# Patient Record
Sex: Female | Born: 1975 | Race: White | Hispanic: No | Marital: Married | Smoking: Former smoker
Health system: Southern US, Community
[De-identification: ages and names within clinical notes are randomized; demographics above are authoritative.]

## PROBLEM LIST (undated history)

## (undated) DIAGNOSIS — N870 Mild cervical dysplasia: Secondary | ICD-10-CM

## (undated) DIAGNOSIS — B9689 Other specified bacterial agents as the cause of diseases classified elsewhere: Secondary | ICD-10-CM

## (undated) DIAGNOSIS — E785 Hyperlipidemia, unspecified: Secondary | ICD-10-CM

## (undated) DIAGNOSIS — F419 Anxiety disorder, unspecified: Secondary | ICD-10-CM

## (undated) DIAGNOSIS — T7840XA Allergy, unspecified, initial encounter: Secondary | ICD-10-CM

## (undated) DIAGNOSIS — Z8639 Personal history of other endocrine, nutritional and metabolic disease: Secondary | ICD-10-CM

## (undated) DIAGNOSIS — M1990 Unspecified osteoarthritis, unspecified site: Secondary | ICD-10-CM

## (undated) DIAGNOSIS — I10 Essential (primary) hypertension: Secondary | ICD-10-CM

## (undated) DIAGNOSIS — N83209 Unspecified ovarian cyst, unspecified side: Secondary | ICD-10-CM

## (undated) DIAGNOSIS — N760 Acute vaginitis: Secondary | ICD-10-CM

## (undated) DIAGNOSIS — E039 Hypothyroidism, unspecified: Secondary | ICD-10-CM

## (undated) DIAGNOSIS — Q8740 Marfan's syndrome, unspecified: Secondary | ICD-10-CM

## (undated) DIAGNOSIS — Z5189 Encounter for other specified aftercare: Secondary | ICD-10-CM

## (undated) DIAGNOSIS — E559 Vitamin D deficiency, unspecified: Secondary | ICD-10-CM

## (undated) DIAGNOSIS — K219 Gastro-esophageal reflux disease without esophagitis: Secondary | ICD-10-CM

## (undated) HISTORY — DX: Encounter for other specified aftercare: Z51.89

## (undated) HISTORY — DX: Vitamin D deficiency, unspecified: E55.9

## (undated) HISTORY — PX: TUBAL LIGATION: SHX77

## (undated) HISTORY — PX: DILATION AND CURETTAGE OF UTERUS: SHX78

## (undated) HISTORY — DX: Unspecified osteoarthritis, unspecified site: M19.90

## (undated) HISTORY — DX: Anxiety disorder, unspecified: F41.9

## (undated) HISTORY — DX: Acute vaginitis: B96.89

## (undated) HISTORY — DX: Hyperlipidemia, unspecified: E78.5

## (undated) HISTORY — DX: Other specified bacterial agents as the cause of diseases classified elsewhere: N76.0

## (undated) HISTORY — DX: Unspecified ovarian cyst, unspecified side: N83.209

## (undated) HISTORY — DX: Marfan syndrome, unspecified: Q87.40

## (undated) HISTORY — PX: TONSILECTOMY/ADENOIDECTOMY WITH MYRINGOTOMY: SHX6125

## (undated) HISTORY — DX: Mild cervical dysplasia: N87.0

## (undated) HISTORY — DX: Personal history of other endocrine, nutritional and metabolic disease: Z86.39

## (undated) HISTORY — DX: Allergy, unspecified, initial encounter: T78.40XA

## (undated) HISTORY — DX: Essential (primary) hypertension: I10

## (undated) HISTORY — DX: Gastro-esophageal reflux disease without esophagitis: K21.9

## (undated) HISTORY — DX: Hypothyroidism, unspecified: E03.9

---

## 2011-11-13 ENCOUNTER — Emergency Department: Payer: Self-pay | Admitting: Unknown Physician Specialty

## 2011-11-13 LAB — URINALYSIS, COMPLETE
Bilirubin,UR: NEGATIVE
Glucose,UR: NEGATIVE mg/dL (ref 0–75)
Ketone: NEGATIVE
Nitrite: NEGATIVE
RBC,UR: 22 /HPF (ref 0–5)
Specific Gravity: 1.021 (ref 1.003–1.030)
Squamous Epithelial: 7
WBC UR: 62 /HPF (ref 0–5)

## 2011-11-13 LAB — PREGNANCY, URINE: Pregnancy Test, Urine: NEGATIVE m[IU]/mL

## 2013-03-22 ENCOUNTER — Emergency Department: Payer: Self-pay | Admitting: Unknown Physician Specialty

## 2013-10-09 ENCOUNTER — Emergency Department: Payer: Self-pay | Admitting: Emergency Medicine

## 2013-10-09 LAB — URINALYSIS, COMPLETE
Bacteria: NONE SEEN
Blood: NEGATIVE
Glucose,UR: NEGATIVE mg/dL (ref 0–75)
Ketone: NEGATIVE
Leukocyte Esterase: NEGATIVE
Nitrite: NEGATIVE
Ph: 6 (ref 4.5–8.0)
Protein: NEGATIVE
RBC,UR: <1 /HPF (ref 0–5)
Specific Gravity: 1.006 (ref 1.003–1.030)
Squamous Epithelial: <1

## 2013-10-09 LAB — PREGNANCY, URINE: Pregnancy Test, Urine: NEGATIVE m[IU]/mL

## 2013-10-23 ENCOUNTER — Emergency Department: Payer: Self-pay | Admitting: Emergency Medicine

## 2013-10-23 LAB — RAPID INFLUENZA A&B ANTIGENS

## 2013-11-28 ENCOUNTER — Emergency Department: Payer: Self-pay | Admitting: Emergency Medicine

## 2014-11-11 ENCOUNTER — Emergency Department: Payer: Self-pay | Admitting: Emergency Medicine

## 2015-04-29 ENCOUNTER — Ambulatory Visit: Payer: Self-pay | Admitting: Family Medicine

## 2015-05-08 ENCOUNTER — Encounter: Payer: Self-pay | Admitting: Family Medicine

## 2015-05-08 ENCOUNTER — Ambulatory Visit (INDEPENDENT_AMBULATORY_CARE_PROVIDER_SITE_OTHER): Payer: 59 | Admitting: Family Medicine

## 2015-05-08 VITALS — BP 118/80 | HR 103 | Temp 97.9°F | Resp 18 | Ht 64.0 in | Wt 151.2 lb

## 2015-05-08 DIAGNOSIS — B351 Tinea unguium: Secondary | ICD-10-CM | POA: Insufficient documentation

## 2015-05-08 DIAGNOSIS — D8989 Other specified disorders involving the immune mechanism, not elsewhere classified: Secondary | ICD-10-CM | POA: Insufficient documentation

## 2015-05-08 DIAGNOSIS — G9332 Myalgic encephalomyelitis/chronic fatigue syndrome: Secondary | ICD-10-CM | POA: Insufficient documentation

## 2015-05-08 DIAGNOSIS — E559 Vitamin D deficiency, unspecified: Secondary | ICD-10-CM

## 2015-05-08 DIAGNOSIS — R5382 Chronic fatigue, unspecified: Secondary | ICD-10-CM

## 2015-05-08 NOTE — Progress Notes (Signed)
Name: Natalie [name]   MRN: [id number]    DOB: [birth year]   Date:05/08/2015       Progress Note  Subjective  Chief Complaint  Chief Complaint  Patient presents with  . OTHER    vitamin d deficency, follow up lab work    HPI Pt. Is here for follow up of Vitamin D deficiency. A level obtained on April 12 was 23.5. She was started on Vitamin D 50000 units once a week. She has one last capsule remaining and is here to recheck her levels. Otherwise doing well. No muscle or joint pains   Past Medical History  Diagnosis Date  . History of Graves' disease     Past Surgical History  Procedure Laterality Date  . Tubal ligation    . Tonsilectomy/adenoidectomy with myringotomy      Family History  Problem Relation Age of Onset  . Hyperlipidemia Mother   . Hyperthyroidism Mother   . Thyroid cancer Father   . Marfan syndrome Father   . Marfan syndrome Sister   . Depression Sister   . Depression Brother     History   Social History  . Marital Status: Married    Spouse Name: N/A  . Number of Children: N/A  . Years of Education: N/A   Occupational History  . Not on file.   Social History Main Topics  . Smoking status: Current Every Day Smoker -- 0.50 packs/day for 10 years    Types: Cigarettes  . Smokeless tobacco: Not on file  . Alcohol Use: No  . Drug Use: No  . Sexual Activity: Not on file   Other Topics Concern  . Not on file   Social History Narrative  . No narrative on file     Current outpatient prescriptions:  .  terbinafine (LAMISIL) 250 MG tablet, Take by mouth., Disp: , Rfl:  .  Multiple Vitamins-Minerals (WOMENS MULTI VITAMIN & MINERAL PO), Take by mouth., Disp: , Rfl:  .  Vitamin D, Ergocalciferol, (DRISDOL) 50000 UNITS CAPS capsule, Take 50,000 Units by mouth once a week., Disp: , Rfl: 0  Allergies  Allergen Reactions  . Hydrocodone-Acetaminophen Itching  . Oxycodone-Acetaminophen Other (See Comments) and Itching     Review of Systems   Musculoskeletal: Negative for myalgias, back pain and joint pain.      Objective  Filed Vitals:   05/08/15 1139  BP: 118/80  Pulse: 103  Temp: 97.9 F (36.6 C)  Resp: 18  Height: 5' 4"  (1.626 m)  Weight: 151 lb 4 oz (68.607 kg)  SpO2: 97%    Physical Exam  Constitutional: She is oriented to person, place, and time and well-developed, well-nourished, and in no distress.  HENT:  Head: Normocephalic and atraumatic.  Cardiovascular: Normal rate and regular rhythm.   Pulmonary/Chest: Effort normal and breath sounds normal.  Abdominal: Soft. Bowel sounds are normal.  Musculoskeletal: She exhibits no edema.  Neurological: She is alert and oriented to person, place, and time.  Skin: Skin is warm and dry.  Nursing note and vitals reviewed.    Assessment & Plan 1. Avitaminosis D Repeat levels today and follow-up. - Vitamin D (25 hydroxy)    Jennie Hannay Asad A. [facility] 05/08/2015 6:35 PM

## 2015-05-27 ENCOUNTER — Other Ambulatory Visit: Payer: Self-pay | Admitting: Family Medicine

## 2015-05-27 MED ORDER — VITAMIN D (ERGOCALCIFEROL) 1.25 MG (50000 UNIT) PO CAPS: 50000.0000 [IU] | ORAL_CAPSULE | ORAL | Status: DC

## 2015-05-27 NOTE — Telephone Encounter (Signed)
Medication has been refilled and sent to [facility] [address]

## 2015-08-11 ENCOUNTER — Ambulatory Visit: Payer: 59 | Admitting: Family Medicine

## 2016-04-06 ENCOUNTER — Encounter: Payer: Self-pay | Admitting: Medical Oncology

## 2016-04-06 ENCOUNTER — Emergency Department: Payer: Self-pay

## 2016-04-06 ENCOUNTER — Emergency Department
Admission: EM | Admit: 2016-04-06 | Discharge: 2016-04-06 | Disposition: A | Discharge status: Home / Self Care | Payer: Self-pay | Attending: Emergency Medicine | Admitting: Emergency Medicine

## 2016-04-06 DIAGNOSIS — J40 Bronchitis, not specified as acute or chronic: Secondary | ICD-10-CM

## 2016-04-06 DIAGNOSIS — F17210 Nicotine dependence, cigarettes, uncomplicated: Secondary | ICD-10-CM | POA: Insufficient documentation

## 2016-04-06 DIAGNOSIS — J209 Acute bronchitis, unspecified: Secondary | ICD-10-CM | POA: Insufficient documentation

## 2016-04-06 MED ORDER — PREDNISONE 50 MG PO TABS: 50.0000 mg | ORAL_TABLET | Freq: Every day | ORAL | Status: DC

## 2016-04-06 MED ORDER — IPRATROPIUM-ALBUTEROL 0.5-2.5 (3) MG/3ML IN SOLN
3.0000 mL | Freq: Once | RESPIRATORY_TRACT | Status: AC
  Administered 2016-04-06: 3 mL via RESPIRATORY_TRACT
  Filled 2016-04-06: qty 3

## 2016-04-06 MED ORDER — ALBUTEROL SULFATE HFA 108 (90 BASE) MCG/ACT IN AERS: 2.0000 | INHALATION_SPRAY | Freq: Four times a day (QID) | RESPIRATORY_TRACT | Status: DC | PRN

## 2016-04-06 NOTE — ED Notes (Signed)
Pt reports she has been having chills, fever, cough and congestion since Thursday.

## 2016-04-06 NOTE — Discharge Instructions (Signed)
Upper Respiratory Infection, Adult Most upper respiratory infections (URIs) are a viral infection of the air passages leading to the lungs. A URI affects the nose, throat, and upper air passages. The most common type of URI is nasopharyngitis and is typically referred to as "the common cold." URIs run their course and usually go away on their own. Most of the time, a URI does not require medical attention, but sometimes a bacterial infection in the upper airways can follow a viral infection. This is called a secondary infection. Sinus and middle ear infections are common types of secondary upper respiratory infections. Bacterial pneumonia can also complicate a URI. A URI can worsen asthma and chronic obstructive pulmonary disease (COPD). Sometimes, these complications can require emergency medical care and may be life threatening.  CAUSES Almost all URIs are caused by viruses. A virus is a type of germ and can spread from one person to another.  RISKS FACTORS You may be at risk for a URI if:   You smoke.   You have chronic heart or lung disease.  You have a weakened defense (immune) system.   You are very young or very old.   You have nasal allergies or asthma.  You work in crowded or poorly ventilated areas.  You work in health care facilities or schools. SIGNS AND SYMPTOMS  Symptoms typically develop 2-3 days after you come in contact with a cold virus. Most viral URIs last 7-10 days. However, viral URIs from the influenza virus (flu virus) can last 14-18 days and are typically more severe. Symptoms may include:   Runny or stuffy (congested) nose.   Sneezing.   Cough.   Sore throat.   Headache.   Fatigue.   Fever.   Loss of appetite.   Pain in your forehead, behind your eyes, and over your cheekbones (sinus pain).  Muscle aches.  DIAGNOSIS  Your health care provider may diagnose a URI by:  Physical exam.  Tests to check that your symptoms are not due to  another condition such as:  Strep throat.  Sinusitis.  Pneumonia.  Asthma. TREATMENT  A URI goes away on its own with time. It cannot be cured with medicines, but medicines may be prescribed or recommended to relieve symptoms. Medicines may help:  Reduce your fever.  Reduce your cough.  Relieve nasal congestion. HOME CARE INSTRUCTIONS   Take medicines only as directed by your health care provider.   Gargle warm saltwater or take cough drops to comfort your throat as directed by your health care provider.  Use a warm mist humidifier or inhale steam from a shower to increase air moisture. This may make it easier to breathe.  Drink enough fluid to keep your urine clear or pale yellow.   Eat soups and other clear broths and maintain good nutrition.   Rest as needed.   Return to work when your temperature has returned to normal or as your health care provider advises. You may need to stay home longer to avoid infecting others. You can also use a face mask and careful hand washing to prevent spread of the virus.  Increase the usage of your inhaler if you have asthma.   Do not use any tobacco products, including cigarettes, chewing tobacco, or electronic cigarettes. If you need help quitting, ask your health care provider. PREVENTION  The best way to protect yourself from getting a cold is to practice good hygiene.   Avoid oral or hand contact with people with cold   symptoms.   Wash your hands often if contact occurs.  There is no clear evidence that vitamin C, vitamin E, echinacea, or exercise reduces the chance of developing a cold. However, it is always recommended to get plenty of rest, exercise, and practice good nutrition.  SEEK MEDICAL CARE IF:   You are getting worse rather than better.   Your symptoms are not controlled by medicine.   You have chills.  You have worsening shortness of breath.  You have brown or red mucus.  You have yellow or brown nasal  discharge.  You have pain in your face, especially when you bend forward.  You have a fever.  You have swollen neck glands.  You have pain while swallowing.  You have white areas in the back of your throat. SEEK IMMEDIATE MEDICAL CARE IF:   You have severe or persistent:  Headache.  Ear pain.  Sinus pain.  Chest pain.  You have chronic lung disease and any of the following:  Wheezing.  Prolonged cough.  Coughing up blood.  A change in your usual mucus.  You have a stiff neck.  You have changes in your:  Vision.  Hearing.  Thinking.  Mood. MAKE SURE YOU:   Understand these instructions.  Will watch your condition.  Will get help right away if you are not doing well or get worse.   This information is not intended to replace advice given to you by your health care provider. Make sure you discuss any questions you have with your health care provider.   Document Released: 04/20/2001 Document Revised: 03/11/2015 Document Reviewed: 01/30/2014 Elsevier Interactive Patient Education 2016 [employer]  

## 2016-04-06 NOTE — ED Notes (Signed)
Cough cold sx's with fever and chills for about  1 week   States cough is non prod but is having some nasal drainage

## 2016-04-08 NOTE — ED Provider Notes (Signed)
[facility] Emergency Department Provider Note  ____________________________________________  Time seen: On arrival  I have reviewed the triage vital signs and the nursing notes.   HISTORY  Chief Complaint Chills and URI    HPI Natalie [name] is a 40 y.o. female who arrives with her daughter both with complaints of nasal congestion and cough. Patient reports she has had symptoms for approximately one week but the cough seems to be getting worse. She reports subjective fevers.. She does complain of fatigue.No recent travel. No shortness of breath. She does smoke cigarettes.    Past Medical History  Diagnosis Date  . History of Graves' disease     Patient Active Problem List   Diagnosis Date Noted  . Avitaminosis D 05/08/2015  . CFIDS (chronic fatigue and immune dysfunction syndrome) 05/08/2015  . Fungal infection of toenail 05/08/2015    Past Surgical History  Procedure Laterality Date  . Tubal ligation    . Tonsilectomy/adenoidectomy with myringotomy      Current Outpatient Rx  Name  Route  Sig  Dispense  Refill  . albuterol (PROVENTIL HFA;VENTOLIN HFA) 108 (90 Base) MCG/ACT inhaler   Inhalation   Inhale 2 puffs into the lungs every 6 (six) hours as needed for wheezing or shortness of breath.   1 Inhaler   2   . Multiple Vitamins-Minerals (WOMENS MULTI VITAMIN & MINERAL PO)   Oral   Take by mouth.         . predniSONE (DELTASONE) 50 MG tablet   Oral   Take 1 tablet (50 mg total) by mouth daily with breakfast.   3 tablet   0   . Vitamin D, Ergocalciferol, (DRISDOL) 50000 UNITS CAPS capsule   Oral   Take 1 capsule (50,000 Units total) by mouth once a week.   12 capsule   0     Allergies Hydrocodone-acetaminophen and Oxycodone-acetaminophen  Family History  Problem Relation Age of Onset  . Hyperlipidemia Mother   . Hyperthyroidism Mother   . Thyroid cancer Father   . Marfan syndrome Father   . Marfan syndrome Sister   .  Depression Sister   . Depression Brother     Social History Social History  Substance Use Topics  . Smoking status: Current Every Day Smoker -- 0.50 packs/day for 10 years    Types: Cigarettes  . Smokeless tobacco: None  . Alcohol Use: No    Review of Systems  Constitutional: As above Eyes: Negative for visual charge ENT: Negative for sore throat    Musculoskeletal: Negative for joint swelling Skin: Negative for rash. Neurological: Negative for headaches or focal weakness   ____________________________________________   PHYSICAL EXAM:  VITAL SIGNS: ED Triage Vitals  Enc Vitals Group     BP 04/06/16 1002 128/97 mmHg     Pulse Rate 04/06/16 1002 95     Resp 04/06/16 1002 18     Temp 04/06/16 1002 98.5 F (36.9 C)     Temp Source 04/06/16 1002 Oral     SpO2 04/06/16 1002 96 %     Weight 04/06/16 1002 160 lb (72.576 kg)     Height 04/06/16 1002 5' 4"  (1.626 m)     Head Cir --      Peak Flow --      Pain Score --      Pain Loc --      Pain Edu? --      Excl. in [place]? --      Constitutional:  Alert and oriented. Well appearing and in no distress. Eyes: Conjunctivae are normal.  ENT   Head: Normocephalic and atraumatic.   Mouth/Throat: Mucous membranes are moist. Cardiovascular: Normal rate, regular rhythm.  Respiratory: Normal respiratory effort without tachypnea nor retractions.  Gastrointestinal: Soft and non-tender in all quadrants. No distention. There is no CVA tenderness. Musculoskeletal: Nontender with normal range of motion in all extremities. Neurologic:  Normal speech and language. No gross focal neurologic deficits are appreciated. Skin:  Skin is warm, dry and intact. No rash noted. Psychiatric: Mood and affect are normal. Patient exhibits appropriate insight and judgment.  ____________________________________________    LABS (pertinent positives/negatives)  Labs Reviewed - No data to  display  ____________________________________________     ____________________________________________    RADIOLOGY I have personally reviewed any xrays that were ordered on this patient: Chest x-ray changes of bronchitis  ____________________________________________   PROCEDURES  Procedure(s) performed: none   ____________________________________________   INITIAL IMPRESSION / ASSESSMENT AND PLAN / ED COURSE  Pertinent labs & imaging results that were available during my care of the patient were reviewed by me and considered in my medical decision making (see chart for details).  She overall well-appearing. Chest x-ray consistent with bronchitis and this fits with her history of present illness as well. We will treat with steroids and an inhaler. PCP follow-up as needed. Return precautions discussed.  ____________________________________________   FINAL CLINICAL IMPRESSION(S) / ED DIAGNOSES  Final diagnoses:  Bronchitis     Lavonia Drafts, MD 04/08/16 [phone]

## 2016-11-03 LAB — HM PAP SMEAR: HM PAP: NEGATIVE

## 2016-11-09 ENCOUNTER — Other Ambulatory Visit: Payer: Self-pay | Admitting: Family Medicine

## 2016-11-09 DIAGNOSIS — Z1231 Encounter for screening mammogram for malignant neoplasm of breast: Secondary | ICD-10-CM

## 2016-12-08 ENCOUNTER — Ambulatory Visit: Payer: Self-pay

## 2016-12-29 ENCOUNTER — Ambulatory Visit
Admission: RE | Admit: 2016-12-29 | Discharge: 2016-12-29 | Disposition: A | Discharge status: Home / Self Care | Payer: Medicaid Other | Source: Ambulatory Visit | Attending: Family Medicine | Admitting: Family Medicine

## 2016-12-29 ENCOUNTER — Encounter: Payer: Self-pay | Admitting: Radiology

## 2016-12-29 DIAGNOSIS — Z1231 Encounter for screening mammogram for malignant neoplasm of breast: Secondary | ICD-10-CM | POA: Diagnosis present

## 2016-12-29 DIAGNOSIS — N6489 Other specified disorders of breast: Secondary | ICD-10-CM | POA: Insufficient documentation

## 2017-02-07 DIAGNOSIS — E785 Hyperlipidemia, unspecified: Secondary | ICD-10-CM | POA: Insufficient documentation

## 2017-02-07 DIAGNOSIS — Z8279 Family history of other congenital malformations, deformations and chromosomal abnormalities: Secondary | ICD-10-CM | POA: Insufficient documentation

## 2017-02-07 DIAGNOSIS — Z87891 Personal history of nicotine dependence: Secondary | ICD-10-CM | POA: Insufficient documentation

## 2017-02-07 DIAGNOSIS — J449 Chronic obstructive pulmonary disease, unspecified: Secondary | ICD-10-CM | POA: Insufficient documentation

## 2017-02-07 HISTORY — DX: Chronic obstructive pulmonary disease, unspecified: J44.9

## 2017-02-09 ENCOUNTER — Encounter: Payer: Self-pay | Admitting: Obstetrics and Gynecology

## 2017-02-09 ENCOUNTER — Ambulatory Visit (INDEPENDENT_AMBULATORY_CARE_PROVIDER_SITE_OTHER): Payer: Medicaid Other | Admitting: Obstetrics and Gynecology

## 2017-02-09 VITALS — BP 121/82 | HR 91 | Ht 64.0 in | Wt 172.1 lb

## 2017-02-09 DIAGNOSIS — R8789 Other abnormal findings in specimens from female genital organs: Secondary | ICD-10-CM

## 2017-02-09 DIAGNOSIS — R87618 Other abnormal cytological findings on specimens from cervix uteri: Secondary | ICD-10-CM

## 2017-02-09 DIAGNOSIS — N951 Menopausal and female climacteric states: Secondary | ICD-10-CM

## 2017-02-09 NOTE — Progress Notes (Signed)
    GYNECOLOGY CLINIC COLPOSCOPY PROCEDURE NOTE  41 y.o. 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  Patient reports history of abnormal pap smear at age 85, had cryotherapy performed.    ROS: perimenopausal vasomotor symptoms.  Has not had a menstrual cycle since June or July 2017.   Patient given informed consent.  Placed in lithotomy position. Cervix viewed with speculum and colposcope after application of acetic acid.   Colposcopy adequate? Yes  no mosaicism, no punctation, no abnormal vasculature and acetowhite lesion(s) noted at 11 o'clock; corresponding biopsies obtained.  ECC specimen obtained. All specimens were labeled and sent to pathology.   Patient was given post procedure instructions.  Will follow up pathology and manage accordingly.  Routine preventative health maintenance measures emphasized.  Given handout on management options for vasomotor symptoms. Can schedule an appointment to discuss options further.     Rubie Maid, MD [facility]

## 2017-02-09 NOTE — Patient Instructions (Addendum)
Human Papillomavirus Human papillomavirus (HPV) is the most common sexually transmitted infection (STI). It easily spreads from person to person (is highly contagious). HPV infections cause genital warts. Certain types of HPV may cause cancers, including cancer of the lower part of the uterus (cervix), vagina, outer female genital area (vulva), penis, anus, and rectum. HPV may also cause cancers of the oral cavity, such as the throat, tongue, and tonsils. There are many types of HPV. It usually does not cause symptoms. However, sometimes there are wart-like lesions in the throat or warts in the genital area that you can see or feel. It is possible to be infected for long periods and pass HPV to others without knowing it. What are the causes? HPV is caused by a virus that spreads from person to person through sexual contact. This includes oral, vaginal, or anal sex. What increases the risk? The following factors may make you more likely to develop this condition:  Having unprotected oral, vaginal, or anal sex.  Having several sex partners.  Having a sex partner who has other sex partners.  Having or having had another STI.  Having a weak disease-fighting (immune) system.  Having damaged skin in the genital area. What are the signs or symptoms? Most people who have HPV do not have any symptoms. If symptoms are present, they may include:  Wartlike lesions in the throat (from having oral sex).  Warts on the infected skin or mucous membranes.  Genital warts that may itch, burn, bleed, or be painful during sexual intercourse. How is this diagnosed? If wartlike lesions are present in the throat or if genital warts are present, your health care provider can usually diagnose HPV with a physical exam. Genital warts are easily seen. In females, tests may be used to diagnose HPV, including:  A Pap test. A Pap test takes a sample of cells from your cervix to check for cancer and HPV infection.  An  HPV test. This is similar to a Pap test and involves taking a sample of cells from your cervix.  Using a scope to view the cervix (colposcopy). This may be done if a pelvic exam or Pap test is abnormal. A sample of tissue may be removed for testing (biopsy) during the colposcopy. Currently, there is no test to detect HPV in males. How is this treated? There is no treatment for the virus itself. However, there are treatments for the health problems and symptoms HPV can cause. Your health care provider will monitor you closely after you are treated as HPV can come back and may need treatment again. Treatment for HPV may include:  Medicines, which may be injected or applied to genital warts in a cream, lotion, liquid or gel form.  Use of a probe to apply extreme cold (cryotherapy) to the genital warts.  Application of an intense beam of light (laser treatment) on the genital warts.  Use of a probe to apply extreme heat (electrocautery) on the genital warts.  Surgery to remove the genital warts. Follow these instructions at home: Medicines   Take over-the-counter and prescription medicines only as told by your health care provider. This include creams for itching or irritation.  Do not treat genital warts with medicines used for treating hand warts. General instructions   Do not touch or scratch the warts.  Do not have sex while you are being treated.  Do not douche or use tampons during treatment (women).  Tell your sex partner about your infection. He or  she may also need to be treated.  If you become pregnant, tell your health care provider that you have HPV. Your health care provider will monitor you closely during pregnancy to make sure your baby is safe.  Keep all follow-up visits as told by your health care provider. This is important. How is this prevented?  Talk with your health care provider about getting the HPV vaccines. These vaccines prevent some HPV infections and  cancers. The vaccines are recommended for males and females between the ages of 49 and 60. They will not work if you already have HPV, and they are not recommended for pregnant women.  After treatment, use condoms during sex to prevent future infections.  Have only one sex partner.  Have a sex partner who does not have other sex partners.  Get regular Pap tests as directed by your health care provider. Contact a health care provider if:  The treated skin becomes red, swollen, or painful.  You have a fever.  You feel generally ill.  You feel lumps or pimples sticking out in and around your genital area.  You develop bleeding of the vagina or the treatment area.  You have painful sexual intercourse. Summary  Human papillomavirus (HPV) is the most common sexually transmitted infection (STI) and is highly contagious.  Most people carrying HPV do not have any symptoms.  HPV can be prevented with vaccination. The vaccine is recommended for males and females between the ages of 52 and 80.  There is no treatment for the virus itself. However, there are treatments for the health problems and symptoms HPV can cause. This information is not intended to replace advice given to you by your health care provider. Make sure you discuss any questions you have with your health care provider. Document Released: 01/15/2004 Document Revised: 10/03/2016 Document Reviewed: 10/03/2016 Elsevier Interactive Patient Education  2017 [employer].     Menopause and Herbal Products What is menopause? Menopause is the normal time of life when menstrual periods decrease in frequency and eventually stop completely. This process can take several years for some women. Menopause is complete when you have had an absence of menstruation for a full year since your last menstrual period. It usually occurs between the ages of 37 and 102. It is not common for menopause to begin before the age of 2. During menopause,  your body stops producing the female hormones estrogen and progesterone. Common symptoms associated with this loss of hormones (vasomotor symptoms) are:  Hot flashes.  Hot flushes.  Night sweats. Other common symptoms and complications of menopause include:  Decrease in sex drive.  Vaginal dryness and thinning of the walls of the vagina. This can make sex painful.  Dryness of the skin and development of wrinkles.  Headaches.  Tiredness.  Irritability.  Memory problems.  Weight gain.  Bladder infections.  Hair growth on the face and chest.  Inability to reproduce offspring (infertility).  Loss of density in the bones (osteoporosis) increasing your risk for breaks (fractures).  Depression.  Hardening and narrowing of the arteries (atherosclerosis). This increases your risk of heart attack and stroke. What treatment options are available? There are many treatment choices for menopause symptoms. The most common treatment is hormone replacement therapy. Many alternative therapies for menopause are emerging, including the use of herbal products. These supplements can be found in the form of herbs, teas, oils, tinctures, and pills. Common herbal supplements for menopause are made from plants that contain phytoestrogens. Phytoestrogens are  compounds that occur naturally in plants and plant products. They act like estrogen in the body. Foods and herbs that contain phytoestrogens include:  Soy.  Flax seeds.  Red clover.  Ginseng. What menopause symptoms may be helped if I use herbal products?  Vasomotor symptoms. These may be helped by:  Soy. Some studies show that soy may have a moderate benefit for hot flashes.  Black cohosh. There is limited evidence indicating this may be beneficial for hot flashes.  Symptoms that are related to heart and blood vessel disease. These may be helped by soy. Studies have shown that soy can help to lower cholesterol.  Depression. This  may be helped by:  St. John's wort. There is limited evidence that shows this may help mild to moderate depression.  Black cohosh. There is evidence that this may help depression and mood swings.  Osteoporosis. Soy may help to decrease bone loss that is associated with menopause and may prevent osteoporosis. Limited evidence indicates that red clover may offer some bone loss protection as well. Other herbal products that are commonly used during menopause lack enough evidence to support their use as a replacement for conventional menopause therapies. These products include evening primrose, ginseng, and red clover. What are the cases when herbal products should not be used during menopause? Do not use herbal products during menopause without your health care provider's approval if:  You are taking medicine.  You have a preexisting liver condition. Are there any risks in my taking herbal products during menopause? If you choose to use herbal products to help with symptoms of menopause, keep in mind that:  Different supplements have different and unmeasured amounts of herbal ingredients.  Herbal products are not regulated the same way that medicines are.  Concentrations of herbs may vary depending on the way they are prepared. For example, the concentration may be different in a pill, tea, oil, and tincture.  Little is known about the risks of using herbal products, particularly the risks of long-term use.  Some herbal supplements can be harmful when combined with certain medicines. Most commonly reported side effects of herbal products are mild. However, if used improperly, many herbal supplements can cause serious problems. Talk to your health care provider before starting any herbal product. If problems develop, stop taking the supplement and let your health care provider know. This information is not intended to replace advice given to you by your health care provider. Make sure you  discuss any questions you have with your health care provider. Document Released: 04/12/2008 Document Revised: 09/21/2016 Document Reviewed: 04/09/2014 Elsevier Interactive Patient Education  2017 [employer].     Menopause and Hormone Replacement Therapy What is hormone replacement therapy? Hormone replacement therapy (HRT) is the use of artificial (synthetic) hormones to replace hormones that your body stops producing during menopause. Menopause is the normal time of life when menstrual periods stop completely and the ovaries stop producing the female hormones estrogen and progesterone. This lack of hormones can affect your health and cause undesirable symptoms. HRT can relieve some of those symptoms. What are my options for HRT? HRT may consist of the synthetic hormones estrogen and progestin, or it may consist of only estrogen (estrogen-only therapy). You and your health care provider will decide which form of HRT is best for you. If you choose to be on HRT and you have a uterus, estrogen and progestin are usually prescribed. Estrogen-only therapy is used for women who do not have a uterus. Possible  options for taking HRT include:  Pills.  Patches.  Gels.  Sprays.  Vaginal cream.  Vaginal rings.  Vaginal inserts. The amount of hormone(s) that you take and how long you take the hormone(s) varies depending on your individual health. It is important to:  Begin HRT with the lowest possible dosage.  Stop HRT as soon as your health care provider tells you to stop.  Work with your health care provider so that you feel informed and comfortable with your decisions. What are the benefits of HRT? HRT can reduce the frequency and severity of menopausal symptoms. Benefits of HRT vary depending on the menopausal symptoms that you have, the severity of your symptoms, and your overall health. HRT may help to improve the following menopausal symptoms:  Hot flashes and night sweats. These  are sudden feelings of heat that spread over the face and body. The skin may turn red, like a blush. Night sweats are hot flashes that happen while you are sleeping or trying to sleep.  Bone loss (osteoporosis). The body loses calcium more quickly after menopause, causing the bones to become weaker. This can increase the risk for bone breaks (fractures).  Vaginal dryness. The lining of the vagina can become thin and dry, which can cause pain during sexual intercourse or cause infection, burning, or itching.  Urinary tract infections.  Urinary incontinence. This is a decreased ability to control when you urinate.  Irritability.  Short-term memory problems. What are the risks of HRT? Risks of HRT vary depending on your individual health and medical history. Risks of HRT also depend on whether you receive both estrogen and progestin or you receive estrogen only.HRT may increase the risk of:  Spotting. This is when a small amount of bloodleaks from the vagina unexpectedly.  Endometrial cancer. This cancer is in the lining of the uterus (endometrium).  Breast cancer.  Increased density of breast tissue. This can make it harder to find breast cancer on a breast X-ray (mammogram).  Stroke.  Heart attack.  Blood clots.  Gallbladder disease. Risks of HRT can increase if you have any of the following conditions:  Endometrial cancer.  Liver disease.  Heart disease.  Breast cancer.  History of blood clots.  History of stroke. How should I care for myself while I am on HRT?  Take over-the-counter and prescription medicines only as told by your health care provider.  Get mammograms, pelvic exams, and medical checkups as often as told by your health care provider.  Have Pap tests done as often as told by your health care provider. A Pap test is sometimes called a Pap smear. It is a screening test that is used to check for signs of cancer of the cervix and vagina. A Pap test can  also identify the presence of infection or precancerous changes. Pap tests may be done:  Every 3 years, starting at age 14.  Every 5 years, starting after age 41, in combination with testing for human papillomavirus (HPV).  More often or less often depending on other medical conditions you have, your age, and other risk factors.  It is your responsibility to get your Pap test results. Ask your health care provider or the department performing the test when your results will be ready.  Keep all follow-up visits as told by your health care provider. This is important. When should I seek medical care? Talk with your health care provider if:  You have any of these:  Pain or swelling in your  legs.  Shortness of breath.  Chest pain.  Lumps or changes in your breasts or armpits.  Slurred speech.  Pain, burning, or bleeding when you urine.  You develop any of these:  Unusual vaginal bleeding.  Dizziness or headaches.  Weakness or numbness in any part of your arms or legs.  Pain in your abdomen. This information is not intended to replace advice given to you by your health care provider. Make sure you discuss any questions you have with your health care provider. Document Released: 07/24/2003 Document Revised: 09/21/2016 Document Reviewed: 04/28/2015 Elsevier Interactive Patient Education  2017 [employer].

## 2017-02-11 LAB — PATHOLOGY

## 2017-02-14 LAB — PATHOLOGY

## 2017-02-15 ENCOUNTER — Encounter: Payer: Self-pay | Admitting: Obstetrics and Gynecology

## 2017-02-15 DIAGNOSIS — N870 Mild cervical dysplasia: Secondary | ICD-10-CM

## 2017-02-15 HISTORY — DX: Mild cervical dysplasia: N87.0

## 2017-02-21 ENCOUNTER — Encounter: Payer: Self-pay | Admitting: Obstetrics and Gynecology

## 2017-02-21 ENCOUNTER — Encounter: Payer: Medicaid Other | Admitting: Obstetrics and Gynecology

## 2017-02-21 ENCOUNTER — Other Ambulatory Visit: Payer: Self-pay | Admitting: Obstetrics and Gynecology

## 2017-02-21 ENCOUNTER — Ambulatory Visit (INDEPENDENT_AMBULATORY_CARE_PROVIDER_SITE_OTHER): Payer: Medicaid Other | Admitting: Obstetrics and Gynecology

## 2017-02-21 VITALS — BP 95/68 | HR 93 | Ht 64.0 in | Wt 174.1 lb

## 2017-02-21 DIAGNOSIS — N870 Mild cervical dysplasia: Secondary | ICD-10-CM

## 2017-02-21 DIAGNOSIS — N72 Inflammatory disease of cervix uteri: Secondary | ICD-10-CM | POA: Diagnosis not present

## 2017-02-21 DIAGNOSIS — B977 Papillomavirus as the cause of diseases classified elsewhere: Secondary | ICD-10-CM

## 2017-02-21 DIAGNOSIS — N951 Menopausal and female climacteric states: Secondary | ICD-10-CM | POA: Diagnosis not present

## 2017-02-21 NOTE — Progress Notes (Signed)
HPI:      [name] is a 41 y.o. [id number] who LMP was No LMP recorded. Patient is not currently having periods (Reason: Irregular Periods).  Subjective:   She presents today For follow-up of her colposcopy.  She has a remote history of cryo-but normal Paps for the last 25+ years. She is also complaining of night and day hot flashes which seem to be getting worse. She says that she has not had a menstrual period since last July.    Hx: The following portions of the patient's history were reviewed and updated as appropriate:              She  has a past medical history of Dysplasia of cervix, low grade (CIN 1) (02/15/2017); History of Graves' disease; Hyperlipemia; Hypertension; Ovarian cyst; and Vitamin D deficiency. She  does not have any pertinent problems on file. She  has a past surgical history that includes Tubal ligation; Tonsilectomy/adenoidectomy with myringotomy; and Dilation and curettage of uterus. Her family history includes Depression in her brother and sister; Hyperlipidemia in her mother; Hyperthyroidism in her mother; Marfan syndrome in her father and sister; Thyroid cancer in her father. She  reports that she quit smoking about 2 months ago. Her smoking use included Cigarettes. She has a 5.00 pack-year smoking history. She has never used smokeless tobacco. She reports that she does not drink alcohol or use drugs. Current Outpatient Prescriptions on File Prior to Visit  Medication Sig Dispense Refill  . atorvastatin (LIPITOR) 40 MG tablet Take by mouth.    Marland Kitchen buPROPion (WELLBUTRIN XL) 300 MG 24 hr tablet Take by mouth.    . gabapentin (NEURONTIN) 300 MG capsule Take by mouth.    . triamterene-hydrochlorothiazide (MAXZIDE-25) 37.5-25 MG tablet Take by mouth.    . Vitamin D, Ergocalciferol, (DRISDOL) 50000 UNITS CAPS capsule Take 1 capsule (50,000 Units total) by mouth once a week. 12 capsule 0   No current facility-administered medications on file prior to visit.           Review of Systems:  Review of Systems  Constitutional: Denied constitutional symptoms, night sweats, recent illness, fatigue, fever, insomnia and weight loss.  Eyes: Denied eye symptoms, eye pain, photophobia, vision change and visual disturbance.  Ears/Nose/Throat/Neck: Denied ear, nose, throat or neck symptoms, hearing loss, nasal discharge, sinus congestion and sore throat.  Cardiovascular: Denied cardiovascular symptoms, arrhythmia, chest pain/pressure, edema, exercise intolerance, orthopnea and palpitations.  Respiratory: Denied pulmonary symptoms, asthma, pleuritic pain, productive sputum, cough, dyspnea and wheezing.  Gastrointestinal: Denied, gastro-esophageal reflux, melena, nausea and vomiting.  Genitourinary: Denied genitourinary symptoms including symptomatic vaginal discharge, pelvic relaxation issues, and urinary complaints.  Musculoskeletal: Denied musculoskeletal symptoms, stiffness, swelling, muscle weakness and myalgia.  Dermatologic: Denied dermatology symptoms, rash and scar.  Neurologic: Denied neurology symptoms, dizziness, headache, neck pain and syncope.  Psychiatric: Denied psychiatric symptoms, anxiety and depression.  Endocrine: See HPI for additional information.   Meds:   Current Outpatient Prescriptions on File Prior to Visit  Medication Sig Dispense Refill  . atorvastatin (LIPITOR) 40 MG tablet Take by mouth.    Marland Kitchen buPROPion (WELLBUTRIN XL) 300 MG 24 hr tablet Take by mouth.    . gabapentin (NEURONTIN) 300 MG capsule Take by mouth.    . triamterene-hydrochlorothiazide (MAXZIDE-25) 37.5-25 MG tablet Take by mouth.    . Vitamin D, Ergocalciferol, (DRISDOL) 50000 UNITS CAPS capsule Take 1 capsule (50,000 Units total) by mouth once a week. 12 capsule 0   No current facility-administered  medications on file prior to visit.     Objective:     Vitals:   02/21/17 1109  BP: 95/68  Pulse: 93   Colposcopy results reviewed directly with the  patient.  Assessment:    [id number] Patient Active Problem List   Diagnosis Date Noted  . Dysplasia of cervix, low grade (CIN 1) 02/15/2017  . Avitaminosis D 05/08/2015  . CFIDS (chronic fatigue and immune dysfunction syndrome) ([place]) 05/08/2015  . Fungal infection of toenail 05/08/2015     1. CIN I (cervical intraepithelial neoplasia I)   2. High risk human papilloma virus (HPV) infection of cervix   3. Symptomatic menopausal or female climacteric states     CIN-1 with normal ECC.   Patient likely in menopause despite early age. This based on hot flashes and lack of menses.   Plan:            1.  HPV I have discussed HPV with the patient in detail.  She is aware that HPV is a sexually transmitted disease and that it is estimated that approximately 2/3 of all sexually active people in this country have this virus.  I reviewed our basic understanding of this virus and its infectivity and transmission.  We have discussed the problems caused by this virus including external condyloma, vaginal and cervical condyloma, cervical dysplasia and its link to cervical cancer.  Treatment of external condyloma, internal condyloma and dysplasia has also been discussed.  The virus and its natural course and history were reviewed in detail.  The necessity for adequate follow-up including pap-smears, viral testing and possible Colposcopy with biopsies has been discussed.  A number of treatment options have also been reviewed.  The use of condoms to help decrease the likelihood of spread of this virus was discussed with the patient.  Literature on HPV virus was provided.  All of the patient's questions were answered.  I have recommended a follow-up colposcopy in 6 months. Should that prove the same were improved consider return to annual Pap.  2.  We have discussed possible HRT and the patient seems interested. I would first prove menopause so I have ordered an [facility]. Patient to be seen for HRT if elevated  FSH. Orders Orders Placed This Encounter  Procedures  . Follicle stimulating hormone          F/U  Return in about 6 months (around 08/23/2017) for We will contact her with any abnormal test results. I spent 19 minutes with this patient of which greater than 50% was spent discussing abnormal Pap smear, colposcopy, colposcopy follow-up, HPV, menopause, HRT.  Finis Bud, M.D. 02/21/2017 11:39 AM

## 2017-02-22 LAB — FOLLICLE STIMULATING HORMONE: FSH: 63 m[IU]/mL

## 2017-02-23 ENCOUNTER — Telehealth: Payer: Self-pay

## 2017-02-23 NOTE — Telephone Encounter (Signed)
-----   Message from Harlin Heys, MD sent at 02/23/2017 11:52 AM EDT ----- Based on these results, please have her schedule a visit with me.

## 2017-02-23 NOTE — Telephone Encounter (Signed)
Message sent through Mychart

## 2017-03-01 ENCOUNTER — Ambulatory Visit (INDEPENDENT_AMBULATORY_CARE_PROVIDER_SITE_OTHER): Payer: Medicaid Other | Admitting: Obstetrics and Gynecology

## 2017-03-01 ENCOUNTER — Encounter: Payer: Self-pay | Admitting: Obstetrics and Gynecology

## 2017-03-01 VITALS — BP 127/81 | HR 103 | Ht 64.5 in | Wt 175.0 lb

## 2017-03-01 DIAGNOSIS — N951 Menopausal and female climacteric states: Secondary | ICD-10-CM | POA: Diagnosis not present

## 2017-03-01 MED ORDER — LEVONORGEST-ETH ESTRAD 91-DAY 0.15-0.03 &0.01 MG PO TABS: 1.0000 | ORAL_TABLET | Freq: Every day | ORAL | 1 refills | Status: DC

## 2017-03-01 NOTE — Progress Notes (Signed)
HPI:      [name] is a 41 y.o. [id number] who LMP was No LMP recorded (exact date). Patient is not currently having periods (Reason: Irregular Periods).  Subjective:   She presents today To discuss her hot flashes and difficulty sleeping. She underwent an [place] was noted to be elevated consistent with menopause.    Hx: The following portions of the patient's history were reviewed and updated as appropriate:              She  has a past medical history of Dysplasia of cervix, low grade (CIN 1) (02/15/2017); History of Graves' disease; Hyperlipemia; Hypertension; Ovarian cyst; and Vitamin D deficiency. She  does not have any pertinent problems on file. She  has a past surgical history that includes Tubal ligation; Tonsilectomy/adenoidectomy with myringotomy; and Dilation and curettage of uterus. Her family history includes Depression in her brother and sister; Hyperlipidemia in her mother; Hyperthyroidism in her mother; Marfan syndrome in her father and sister; Thyroid cancer in her father. She  reports that she quit smoking about 2 months ago. Her smoking use included Cigarettes. She has a 5.00 pack-year smoking history. She has never used smokeless tobacco. She reports that she does not drink alcohol or use drugs. Current Outpatient Prescriptions on File Prior to Visit  Medication Sig Dispense Refill  . atorvastatin (LIPITOR) 40 MG tablet Take by mouth.    Marland Kitchen buPROPion (WELLBUTRIN XL) 300 MG 24 hr tablet Take by mouth.    . gabapentin (NEURONTIN) 300 MG capsule Take by mouth.    . triamterene-hydrochlorothiazide (MAXZIDE-25) 37.5-25 MG tablet Take by mouth.    . Vitamin D, Ergocalciferol, (DRISDOL) 50000 UNITS CAPS capsule Take 1 capsule (50,000 Units total) by mouth once a week. 12 capsule 0   No current facility-administered medications on file prior to visit.          Review of Systems:  Review of Systems  Constitutional: Denied constitutional symptoms, night sweats, recent  illness, fatigue, fever, insomnia and weight loss.  Eyes: Denied eye symptoms, eye pain, photophobia, vision change and visual disturbance.  Ears/Nose/Throat/Neck: Denied ear, nose, throat or neck symptoms, hearing loss, nasal discharge, sinus congestion and sore throat.  Cardiovascular: Denied cardiovascular symptoms, arrhythmia, chest pain/pressure, edema, exercise intolerance, orthopnea and palpitations.  Respiratory: Denied pulmonary symptoms, asthma, pleuritic pain, productive sputum, cough, dyspnea and wheezing.  Gastrointestinal: Denied, gastro-esophageal reflux, melena, nausea and vomiting.  Genitourinary: Denied genitourinary symptoms including symptomatic vaginal discharge, pelvic relaxation issues, and urinary complaints.  Musculoskeletal: Denied musculoskeletal symptoms, stiffness, swelling, muscle weakness and myalgia.  Dermatologic: Denied dermatology symptoms, rash and scar.  Neurologic: Denied neurology symptoms, dizziness, headache, neck pain and syncope.  Psychiatric: Denied psychiatric symptoms, anxiety and depression.  Endocrine: See HPI for additional information.   Meds:   Current Outpatient Prescriptions on File Prior to Visit  Medication Sig Dispense Refill  . atorvastatin (LIPITOR) 40 MG tablet Take by mouth.    Marland Kitchen buPROPion (WELLBUTRIN XL) 300 MG 24 hr tablet Take by mouth.    . gabapentin (NEURONTIN) 300 MG capsule Take by mouth.    . triamterene-hydrochlorothiazide (MAXZIDE-25) 37.5-25 MG tablet Take by mouth.    . Vitamin D, Ergocalciferol, (DRISDOL) 50000 UNITS CAPS capsule Take 1 capsule (50,000 Units total) by mouth once a week. 12 capsule 0   No current facility-administered medications on file prior to visit.     Objective:     Vitals:   03/01/17 1324  BP: 127/81  Pulse: (!) 103  Assessment:    [id number] Patient Active Problem List   Diagnosis Date Noted  . Dysplasia of cervix, low grade (CIN 1) 02/15/2017  . Avitaminosis D  05/08/2015  . CFIDS (chronic fatigue and immune dysfunction syndrome) ([place]) 05/08/2015  . Fungal infection of toenail 05/08/2015     1. Symptomatic menopausal or female climacteric states     [place] elevated consistent with menopausal state. This is somewhat early menopause at age 56.  Of significant note patient has discontinued tobacco use and has remained off of it for more than 6 months.   Plan:            1.  HRT I have discussed HRT with the patient in detail.  The risk/benefits of it were reviewed.  She understands that during menopause Estrogen decreases dramatically and that this results in an increased risk of cardiovascular disease as well as osteoporosis.  We have also discussed the fact that hot flashes often result from a decrease in Estrogen, and that by replacing Estrogen, they can often be alleviated.  We have discussed skin, vaginal and urinary tract changes that may also take place from this drop in Estrogen.  Emotional changes have also been linked to Estrogen and we have briefly discussed this.  The benefits of HRT including decrease in hot flashes, vaginal dryness, and osteoporosis were discussed.  The emotional benefit and a possible change in her cardiovascular risk profile was also reviewed.  The risks associated with Hormone Replacement Therapy were also reviewed.  The use of unopposed Estrogen and its relationship to endometrial cancer was discussed.  The addition of Progesterone and its beneficial effect on endometrial cancer was also noted.  The fact that there has been no consistent definitive studies showing an increase in breast cancer in women who use HRT was discussed with the patient.  The possible side effects including breast tenderness, fluid retention, mood changes and vaginal bleeding were discussed.  The patient was informed that this is an elective medication and that she may choose not to take Hormone Replacement Therapy.  Literature on HRT was given, and I  believe that after answering all of the patient's questions, she has an adequate and informed understanding of HRT.  Special emphasis on the WHI study, as well as several studies since that pertaining to the risks and benefits of estrogen replacement therapy were compared.  The possible limitations of these studies were discussed including the age stratification of the WHI study.  The possible role of Progesterone in these studies was discussed in detail.  I believe that the patient has an informed knowledge of the risks and benefits of HRT.  I have specifically discussed WHI findings and current updates.  Different type of hormone formulation and methods of taking hormone replacement therapy discussed.  We have specifically discussed multiple forms of HRT including patches and pills. We have discussed the risks and benefits of menopausal levels of HRT versus OCPs. We have especially considering the young age of this patient. She has elected to begin OCPs. Orders No orders of the defined types were placed in this encounter.    Meds ordered this encounter  Medications  . Levonorgestrel-Ethinyl Estradiol (AMETHIA,CAMRESE) 0.15-0.03 &0.01 MG tablet    Sig: Take 1 tablet by mouth at bedtime.    Dispense:  84 tablet    Refill:  1        F/U  Return in about 3 months (around 05/31/2017). I spent 20 minutes with this patient of which  greater than 50% was spent discussing HRT and all of its risks benefits and methods.  Finis Bud, M.D. 03/01/2017 2:47 PM

## 2017-03-23 ENCOUNTER — Telehealth: Payer: Self-pay | Admitting: Obstetrics and Gynecology

## 2017-03-23 NOTE — Telephone Encounter (Signed)
Patient is still having the hot flashes and trouble sleeping  Please call concerning the [facility] pills

## 2017-04-07 ENCOUNTER — Telehealth: Payer: Self-pay | Admitting: Obstetrics and Gynecology

## 2017-04-07 NOTE — Telephone Encounter (Signed)
Patient lvm stating she has questions about current medication the patient is taking. Patient did not disclose any other information. Please advise.

## 2017-05-31 ENCOUNTER — Encounter: Payer: Medicaid Other | Admitting: Obstetrics and Gynecology

## 2017-06-07 ENCOUNTER — Encounter: Payer: Medicaid Other | Admitting: Obstetrics and Gynecology

## 2017-08-23 ENCOUNTER — Encounter: Payer: Medicaid Other | Admitting: Obstetrics and Gynecology

## 2017-09-07 ENCOUNTER — Ambulatory Visit (INDEPENDENT_AMBULATORY_CARE_PROVIDER_SITE_OTHER): Payer: Medicaid Other | Admitting: Obstetrics and Gynecology

## 2017-09-07 ENCOUNTER — Encounter: Payer: Self-pay | Admitting: Obstetrics and Gynecology

## 2017-09-07 VITALS — BP 116/79 | HR 94 | Ht 64.5 in | Wt 177.1 lb

## 2017-09-07 DIAGNOSIS — N951 Menopausal and female climacteric states: Secondary | ICD-10-CM

## 2017-09-07 DIAGNOSIS — N870 Mild cervical dysplasia: Secondary | ICD-10-CM

## 2017-09-07 MED ORDER — CONJ ESTROG-MEDROXYPROGEST ACE 0.625-5 MG PO TABS: 1.0000 | ORAL_TABLET | Freq: Every day | ORAL | 3 refills | Status: DC

## 2017-09-07 NOTE — Progress Notes (Signed)
    GYNECOLOGY PROGRESS NOTE  Subjective:    Patient ID: Natalie [name], female    DOB: 1976-06-23, 41 y.o.   MRN: [id number]  HPI  Patient is a 41 y.o. [id number] female who presents for 6 month colposcopy follow for history of abnormal pap smear. Patient with NILM HPV HR+ pap smear on 11/03/2016.  She then underwent colposcopy on 02/09/17, which noted CIN I on cervical biopsy with negative ECC.  Denies complaints today.   Of note, patient also desires to discuss management of her menopausal vasomotor symptoms and difficulty sleeping. Patient's menopausal status was confirmed by laboratory assessment.  States that she was placed on OCPs last visit.  Notes that the medication did help the hot flashes, but caused her to start having a period again so she discontinued  The following portions of the patient's history were reviewed and updated as appropriate: allergies, current medications, past family history, past medical history, past social history, past surgical history and problem list.  Review of Systems Pertinent items noted in HPI and remainder of comprehensive ROS otherwise negative.   Objective:   Blood pressure 116/79, pulse 94, height 5' 4.5" (1.638 m), weight 177 lb 1.6 oz (80.3 kg). General appearance: alert and no distress Abdomen: soft, non-tender; bowel sounds normal; no masses,  no organomegaly Pelvic: see below in colposcopy procedure note   Assessment:   CIN I Menopausal vasomotor symptoms  Plan:   1. CIN I - patient presented for colposcopy (see procedure note below).  Will f/u pathology and manage accordingly.  2. Menopausal symptoms - discussed other treatment options for patient, including menopausal levels of HRT as well as non-hormonal options (i.e. Brisdelle, Effexor, Clonidine).  Patient notes she will try the menopausal levels of HRT.  Given handouts on both.  Initiated on Prempro.  To f/u if symptoms worsen.     GYNECOLOGY CLINIC COLPOSCOPY PROCEDURE  NOTE  41 y.o. [id number] here for colposcopy for CIN I on previous biopsy 02/2017,  With NILM HR HPV+ pap smear on 11/03/2017. 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  Patient given informed consent, signed copy in the chart, time out was performed.  Placed in lithotomy position. Cervix viewed with speculum and colposcope after application of acetic acid.   Colposcopy adequate? Yes  no visible lesions, no mosaicism, no punctation and no abnormal vasculature; no biopsies obtained.  ECC specimen obtained. All specimens were labeled and sent to pathology.   Patient was given post procedure instructions.  Will follow up pathology and manage accordingly; patient will be contacted with results and recommendations.  Routine preventative health maintenance measures emphasized.    A total of 15 minutes were spent face-to-face with the patient during this encounter and over half of that time dealt with counseling and coordination of care.   Rubie Maid, MD [facility]

## 2017-09-09 LAB — PATHOLOGY

## 2017-11-29 ENCOUNTER — Other Ambulatory Visit: Payer: Self-pay | Admitting: Family Medicine

## 2017-12-01 ENCOUNTER — Other Ambulatory Visit: Payer: Self-pay | Admitting: Family Medicine

## 2017-12-01 DIAGNOSIS — Z1231 Encounter for screening mammogram for malignant neoplasm of breast: Secondary | ICD-10-CM

## 2017-12-29 ENCOUNTER — Telehealth: Payer: Self-pay | Admitting: Obstetrics and Gynecology

## 2017-12-29 NOTE — Telephone Encounter (Signed)
The patient called and stated that she would like to have a nurse call her back in regards to her getting her Gardisel injection. Please advise.

## 2018-01-02 NOTE — Telephone Encounter (Signed)
Pt received a called back and she is going to make an appt to get her tdap shot soon.

## 2018-01-09 ENCOUNTER — Ambulatory Visit (INDEPENDENT_AMBULATORY_CARE_PROVIDER_SITE_OTHER): Payer: Medicaid Other | Admitting: Certified Nurse Midwife

## 2018-01-09 DIAGNOSIS — Z23 Encounter for immunization: Secondary | ICD-10-CM

## 2018-01-09 NOTE — Progress Notes (Signed)
Pt is here for her 1st gardasil inj, she tolerated well

## 2018-01-10 NOTE — Progress Notes (Signed)
I have reviewed the record and concur with patient management and plan.    Diona Fanti, CNM [facility], [facility]

## 2018-01-23 ENCOUNTER — Encounter: Payer: Self-pay | Admitting: Radiology

## 2018-01-23 ENCOUNTER — Ambulatory Visit
Admission: RE | Admit: 2018-01-23 | Discharge: 2018-01-23 | Disposition: A | Discharge status: Home / Self Care | Payer: Medicaid Other | Source: Ambulatory Visit | Attending: Family Medicine | Admitting: Family Medicine

## 2018-01-23 DIAGNOSIS — Z1231 Encounter for screening mammogram for malignant neoplasm of breast: Secondary | ICD-10-CM | POA: Diagnosis not present

## 2018-03-09 ENCOUNTER — Encounter: Payer: Medicaid Other | Admitting: Obstetrics and Gynecology

## 2018-03-10 ENCOUNTER — Ambulatory Visit: Payer: Medicaid Other

## 2018-03-13 ENCOUNTER — Ambulatory Visit: Payer: Medicaid Other

## 2018-03-17 ENCOUNTER — Ambulatory Visit (INDEPENDENT_AMBULATORY_CARE_PROVIDER_SITE_OTHER): Payer: Medicaid Other | Admitting: Obstetrics and Gynecology

## 2018-03-17 VITALS — BP 116/88 | HR 81 | Ht 64.5 in | Wt 185.2 lb

## 2018-03-17 DIAGNOSIS — Z23 Encounter for immunization: Secondary | ICD-10-CM | POA: Diagnosis not present

## 2018-03-17 NOTE — Progress Notes (Signed)
Pt is here for a NV for #2 Gardasil inj.

## 2018-03-23 ENCOUNTER — Encounter: Payer: Self-pay | Admitting: Obstetrics and Gynecology

## 2018-03-23 NOTE — Progress Notes (Signed)
I have reviewed the record and concur with patient management and plan.  Rubie Maid, MD [facility]

## 2018-04-14 ENCOUNTER — Encounter: Payer: Self-pay | Admitting: Obstetrics and Gynecology

## 2018-04-14 ENCOUNTER — Encounter: Payer: Medicaid Other | Admitting: Obstetrics and Gynecology

## 2018-04-14 ENCOUNTER — Ambulatory Visit (INDEPENDENT_AMBULATORY_CARE_PROVIDER_SITE_OTHER): Payer: Medicaid Other | Admitting: Obstetrics and Gynecology

## 2018-04-14 VITALS — BP 105/74 | HR 94 | Ht 64.5 in | Wt 188.6 lb

## 2018-04-14 DIAGNOSIS — R635 Abnormal weight gain: Secondary | ICD-10-CM

## 2018-04-14 DIAGNOSIS — Z0000 Encounter for general adult medical examination without abnormal findings: Secondary | ICD-10-CM

## 2018-04-14 DIAGNOSIS — N951 Menopausal and female climacteric states: Secondary | ICD-10-CM | POA: Diagnosis not present

## 2018-04-14 DIAGNOSIS — Z01419 Encounter for gynecological examination (general) (routine) without abnormal findings: Secondary | ICD-10-CM | POA: Diagnosis not present

## 2018-04-14 DIAGNOSIS — N870 Mild cervical dysplasia: Secondary | ICD-10-CM

## 2018-04-14 DIAGNOSIS — E669 Obesity, unspecified: Secondary | ICD-10-CM | POA: Diagnosis not present

## 2018-04-14 MED ORDER — CONJ ESTROG-MEDROXYPROGEST ACE 0.625-2.5 MG PO TABS: 1.0000 | ORAL_TABLET | Freq: Every day | ORAL | 6 refills | Status: DC

## 2018-04-14 NOTE — Progress Notes (Addendum)
GYNECOLOGY ANNUAL PHYSICAL EXAM PROGRESS NOTE  Subjective:    Natalie [name] is a 42 y.o. [phone] female who presents for an annual exam.  The patient is sexually active. The patient has been on hormone replacement therapy in the past (for ~ 2-3 months) but began noting side effects (restarted her periods, PMB?). The patient wears seatbelts: yes. The patient participates in regular exercise: is just initiating an exercise program at the gym.Marland Kitchen Has the patient ever been transfused or tattooed?: no. The patient reports that there is not domestic violence in her life.   The patient has the following complaints today: 1.  The patient complains of weight gain since discontinuing her HRT.  She has gained approximately 30 pounds over the past 6 months.  Notes that she has started an exercise program recently at her gym, and has tried several over-the-counter weight loss supplements (cayenne pepper, apple-cider vinegar) with no success. 2. Notes her hot flashes have returned since stopping the HRT.    Gynecologic History Menarche age: 71 No LMP recorded. Patient is menopausal. Contraception: post menopausal status History of STI's: H/o HPV Last Pap: 10/2017. Results were: abnormal (NILM but + HR HPV) with CIN I on colposcopy in 01/2018.  Reports remote h/o mildly abnormal pap smear in the past. Last mammogram: 01/2018. Results were: normal   OB History  Gravida Para Term Preterm AB Living  5 3 2 1 2 2   SAB TAB Ectopic Multiple Live Births  2 0 0 0 3    # Outcome Date GA Lbr Len/2nd Weight Sex Delivery Anes PTL Lv  5 Term         LIV  4 SAB           3 SAB           2 Term         ND  1 Preterm     F Vag-Spont   LIV    Past Medical History:  Diagnosis Date  . Dysplasia of cervix, low grade (CIN 1) 02/15/2017   Will need repeat pap smear in 1 year.   Marland Kitchen History of Graves' disease   . Hyperlipemia   . Hypertension   . Ovarian cyst   . Vitamin D deficiency     Past Surgical History:   Procedure Laterality Date  . DILATION AND CURETTAGE OF UTERUS    . TONSILECTOMY/ADENOIDECTOMY WITH MYRINGOTOMY    . TUBAL LIGATION      Family History  Problem Relation Age of Onset  . Hyperlipidemia Mother   . Hyperthyroidism Mother   . Thyroid cancer Father   . Marfan syndrome Father   . Marfan syndrome Sister   . Depression Sister   . Depression Brother     Social History   Socioeconomic History  . Marital status: Married    Spouse name: Not on file  . Number of children: Not on file  . Years of education: Not on file  . Highest education level: Not on file  Occupational History  . Not on file  Social Needs  . Financial resource strain: Not on file  . Food insecurity:    Worry: Not on file    Inability: Not on file  . Transportation needs:    Medical: Not on file    Non-medical: Not on file  Tobacco Use  . Smoking status: Former Smoker    Packs/day: 0.50    Years: 10.00    Pack years: 5.00  Types: Cigarettes    Last attempt to quit: 12/12/2016    Years since quitting: 1.3  . Smokeless tobacco: Never Used  Substance and Sexual Activity  . Alcohol use: No    Alcohol/week: 0.0 oz  . Drug use: No  . Sexual activity: Yes    Birth control/protection: None  Lifestyle  . Physical activity:    Days per week: Not on file    Minutes per session: Not on file  . Stress: Not on file  Relationships  . Social connections:    Talks on phone: Not on file    Gets together: Not on file    Attends religious service: Not on file    Active member of club or organization: Not on file    Attends meetings of clubs or organizations: Not on file    Relationship status: Not on file  . Intimate partner violence:    Fear of current or ex partner: Not on file    Emotionally abused: Not on file    Physically abused: Not on file    Forced sexual activity: Not on file  Other Topics Concern  . Not on file  Social History Narrative  . Not on file    Current Outpatient  Medications on File Prior to Visit  Medication Sig Dispense Refill  . atorvastatin (LIPITOR) 40 MG tablet Take by mouth.    Marland Kitchen buPROPion (WELLBUTRIN XL) 300 MG 24 hr tablet Take by mouth.    . gabapentin (NEURONTIN) 300 MG capsule Take by mouth.    . meclizine (ANTIVERT) 25 MG tablet Take 25 mg by mouth 3 (three) times daily as needed.  2  . mometasone (ELOCON) 0.1 % ointment 1 APPLICATION TOPICAL EVERY DAY  1  . pantoprazole (PROTONIX) 40 MG tablet Take 40 mg by mouth daily.  5  . triamterene-hydrochlorothiazide (MAXZIDE-25) 37.5-25 MG tablet Take by mouth.    . Vitamin D, Ergocalciferol, (DRISDOL) 50000 UNITS CAPS capsule Take 1 capsule (50,000 Units total) by mouth once a week. 12 capsule 0  . estrogen, conjugated,-medroxyprogesterone (PREMPRO) 0.625-5 MG tablet Take 1 tablet by mouth daily. (Patient not taking: Reported on 03/17/2018) 30 tablet 3   No current facility-administered medications on file prior to visit.     Allergies  Allergen Reactions  . Hydrocodone-Acetaminophen Itching  . Oxycodone-Acetaminophen Other (See Comments) and Itching     Review of Systems Constitutional: negative for chills, fatigue, fevers and sweats.  Positive for weight gain Eyes: negative for irritation, redness and visual disturbance Ears, nose, mouth, throat, and face: negative for hearing loss, nasal congestion, snoring and tinnitus Respiratory: negative for asthma, cough, sputum Cardiovascular: negative for chest pain, dyspnea, exertional chest pressure/discomfort, irregular heart beat, palpitations and syncope Gastrointestinal: negative for abdominal pain, change in bowel habits, nausea and vomiting Genitourinary: negative for abnormal menstrual periods, genital lesions, sexual problems and vaginal discharge, dysuria and urinary incontinence Integument/breast: negative for breast lump, breast tenderness and nipple discharge Hematologic/lymphatic: negative for bleeding and easy  bruising Musculoskeletal:negative for back pain and muscle weakness Neurological: negative for dizziness, headaches, vertigo and weakness Endocrine: negative for diabetic symptoms including polydipsia, polyuria and skin dryness Allergic/Immunologic: negative for hay fever and urticaria      Objective:  Blood pressure 105/74, pulse 94, height 5' 4.5" (1.638 m), weight 188 lb 9.6 oz (85.5 kg). Body mass index is 31.87 kg/m.   General Appearance:    Alert, cooperative, no distress, appears stated age  Head:    Normocephalic, without obvious  abnormality, atraumatic  Eyes:    PERRL, conjunctiva/corneas clear, EOM's intact, both eyes  Ears:    Normal external ear canals, both ears  Nose:   Nares normal, septum midline, mucosa normal, no drainage or sinus tenderness  Throat:   Lips, mucosa, and tongue normal; teeth and gums normal  Neck:   Supple, symmetrical, trachea midline, no adenopathy; thyroid: no enlargement/tenderness/nodules; no carotid bruit or JVD  Back:     Symmetric, no curvature, ROM normal, no CVA tenderness  Lungs:     Clear to auscultation bilaterally, respirations unlabored  Chest Wall:    No tenderness or deformity   Heart:    Regular rate and rhythm, S1 and S2 normal, no murmur, rub or gallop  Breast Exam:    No tenderness, masses, or nipple abnormality  Abdomen:     Soft, non-tender, bowel sounds active all four quadrants, no masses, no organomegaly.    Genitalia:    Pelvic:external genitalia normal, vagina without lesions, discharge, or tenderness, rectovaginal septum  normal. Cervix normal in appearance, no cervical motion tenderness, no adnexal masses or tenderness.  Uterus normal size, shape, mobile, regular contours, nontender.  Rectal:    Normal external sphincter.  No hemorrhoids appreciated. Internal exam not done.   Extremities:   Extremities normal, atraumatic, no cyanosis or edema  Pulses:   2+ and symmetric all extremities  Skin:   Skin color, texture, turgor  normal, no rashes or lesions  Lymph nodes:   Cervical, supraclavicular, and axillary nodes normal  Neurologic:   CNII-XII intact, normal strength, sensation and reflexes throughout   .  Labs:  No results found for: WBC, HGB, HCT, MCV, PLT  No results found for: CREATININE, BUN, NA, K, CL, CO2  No results found for: ALT, AST, GGT, ALKPHOS, BILITOT  No results found for: TSH   Assessment:   Well woman exam with routine gynecological exam Menopausal symptoms Recent weight gain CIN I (cervical intraepithelial neoplasia I) Obesity (BMI 30.0-34.9)   Plan:     Blood tests: None.  Patient plans to have labs drawn by her PCP within the next 2 weeks. Breast self exam technique reviewed and patient encouraged to perform self-exam monthly. Contraception: post menopausal status. Discussed healthy lifestyle modifications. Discussed other herbal/OTC remedies for weight management. Also counseled on dietary modification and patient has just recently initiated an exercise program. If no change in symptoms, can consider prescription medication for weight loss management.  Mammogram up-to-date. Pap smear to be performed in December.  Patient will follow-up in 6 months Patient with bothersome menopausal vasomotor symptoms. Reiterated lifestyle interventions such as wearing light clothing, remaining in cool environments, having fan/air conditioner in the room, avoiding hot beverages etc.  Discussed using hormone therapy and concerns about increased risk of heart disease, cerebrovascular disease, thromboembolic disease, and breast cancer.  Also discussed other medical options such as Paxil, Effexor, Brisdelle, Clonidine,  or Neurontin.  Patient opted to resume her HRT, but will try lowering dose of progesterone therapy. Prempro 0.625/2.5 mg prescribed She will noitify by Mychart if she has any issues as her current insurance ([employer]), limits the number of problem visits.     Rubie Maid, MD [facility]

## 2018-04-14 NOTE — Progress Notes (Signed)
Pt is present today for her annual woman exam. Pt stated that since she has stopped taking the medication for menopause she has been gaining weight. Pt stated that she is excercising but still gains weight. Pt stated that she is doing well other wise and denies any itching, burning or discharge.

## 2018-04-14 NOTE — Patient Instructions (Signed)
Health Maintenance for Postmenopausal Women Menopause is a normal process in which your reproductive ability comes to an end. This process happens gradually over a span of months to years, usually between the ages of 22 and 9. Menopause is complete when you have missed 12 consecutive menstrual periods. It is important to talk with your health care provider about some of the most common conditions that affect postmenopausal women, such as heart disease, cancer, and bone loss (osteoporosis). Adopting a healthy lifestyle and getting preventive care can help to promote your health and wellness. Those actions can also lower your chances of developing some of these common conditions. What should I know about menopause? During menopause, you may experience a number of symptoms, such as:  Moderate-to-severe hot flashes.  Night sweats.  Decrease in sex drive.  Mood swings.  Headaches.  Tiredness.  Irritability.  Memory problems.  Insomnia.  Choosing to treat or not to treat menopausal changes is an individual decision that you make with your health care provider. What should I know about hormone replacement therapy and supplements? Hormone therapy products are effective for treating symptoms that are associated with menopause, such as hot flashes and night sweats. Hormone replacement carries certain risks, especially as you become older. If you are thinking about using estrogen or estrogen with progestin treatments, discuss the benefits and risks with your health care provider. What should I know about heart disease and stroke? Heart disease, heart attack, and stroke become more likely as you age. This may be due, in part, to the hormonal changes that your body experiences during menopause. These can affect how your body processes dietary fats, triglycerides, and cholesterol. Heart attack and stroke are both medical emergencies. There are many things that you can do to help prevent heart disease  and stroke:  Have your blood pressure checked at least every 1-2 years. High blood pressure causes heart disease and increases the risk of stroke.  If you are 53-22 years old, ask your health care provider if you should take aspirin to prevent a heart attack or a stroke.  Do not use any tobacco products, including cigarettes, chewing tobacco, or electronic cigarettes. If you need help quitting, ask your health care provider.  It is important to eat a healthy diet and maintain a healthy weight. ? Be sure to include plenty of vegetables, fruits, low-fat dairy products, and lean protein. ? Avoid eating foods that are high in solid fats, added sugars, or salt (sodium).  Get regular exercise. This is one of the most important things that you can do for your health. ? Try to exercise for at least 150 minutes each week. The type of exercise that you do should increase your heart rate and make you sweat. This is known as moderate-intensity exercise. ? Try to do strengthening exercises at least twice each week. Do these in addition to the moderate-intensity exercise.  Know your numbers.Ask your health care provider to check your cholesterol and your blood glucose. Continue to have your blood tested as directed by your health care provider.  What should I know about cancer screening? There are several types of cancer. Take the following steps to reduce your risk and to catch any cancer development as early as possible. Breast Cancer  Practice breast self-awareness. ? This means understanding how your breasts normally appear and feel. ? It also means doing regular breast self-exams. Let your health care provider know about any changes, no matter how small.  If you are 40  or older, have a clinician do a breast exam (clinical breast exam or CBE) every year. Depending on your age, family history, and medical history, it may be recommended that you also have a yearly breast X-ray (mammogram).  If you  have a family history of breast cancer, talk with your health care provider about genetic screening.  If you are at high risk for breast cancer, talk with your health care provider about having an MRI and a mammogram every year.  Breast cancer (BRCA) gene test is recommended for women who have family members with BRCA-related cancers. Results of the assessment will determine the need for genetic counseling and BRCA1 and for BRCA2 testing. BRCA-related cancers include these types: ? Breast. This occurs in males or females. ? Ovarian. ? Tubal. This may also be called fallopian tube cancer. ? Cancer of the abdominal or pelvic lining (peritoneal cancer). ? Prostate. ? Pancreatic.  Cervical, Uterine, and Ovarian Cancer Your health care provider may recommend that you be screened regularly for cancer of the pelvic organs. These include your ovaries, uterus, and vagina. This screening involves a pelvic exam, which includes checking for microscopic changes to the surface of your cervix (Pap test).  For women ages 21-65, health care providers may recommend a pelvic exam and a Pap test every three years. For women ages 79-65, they may recommend the Pap test and pelvic exam, combined with testing for human papilloma virus (HPV), every five years. Some types of HPV increase your risk of cervical cancer. Testing for HPV may also be done on women of any age who have unclear Pap test results.  Other health care providers may not recommend any screening for nonpregnant women who are considered low risk for pelvic cancer and have no symptoms. Ask your health care provider if a screening pelvic exam is right for you.  If you have had past treatment for cervical cancer or a condition that could lead to cancer, you need Pap tests and screening for cancer for at least 20 years after your treatment. If Pap tests have been discontinued for you, your risk factors (such as having a new sexual partner) need to be  reassessed to determine if you should start having screenings again. Some women have medical problems that increase the chance of getting cervical cancer. In these cases, your health care provider may recommend that you have screening and Pap tests more often.  If you have a family history of uterine cancer or ovarian cancer, talk with your health care provider about genetic screening.  If you have vaginal bleeding after reaching menopause, tell your health care provider.  There are currently no reliable tests available to screen for ovarian cancer.  Lung Cancer Lung cancer screening is recommended for adults 69-62 years old who are at high risk for lung cancer because of a history of smoking. A yearly low-dose CT scan of the lungs is recommended if you:  Currently smoke.  Have a history of at least 30 pack-years of smoking and you currently smoke or have quit within the past 15 years. A pack-year is smoking an average of one pack of cigarettes per day for one year.  Yearly screening should:  Continue until it has been 15 years since you quit.  Stop if you develop a health problem that would prevent you from having lung cancer treatment.  Colorectal Cancer  This type of cancer can be detected and can often be prevented.  Routine colorectal cancer screening usually begins at  age 42 and continues through age 45.  If you have risk factors for colon cancer, your health care provider may recommend that you be screened at an earlier age.  If you have a family history of colorectal cancer, talk with your health care provider about genetic screening.  Your health care provider may also recommend using home test kits to check for hidden blood in your stool.  A small camera at the end of a tube can be used to examine your colon directly (sigmoidoscopy or colonoscopy). This is done to check for the earliest forms of colorectal cancer.  Direct examination of the colon should be repeated every  5-10 years until age 71. However, if early forms of precancerous polyps or small growths are found or if you have a family history or genetic risk for colorectal cancer, you may need to be screened more often.  Skin Cancer  Check your skin from head to toe regularly.  Monitor any moles. Be sure to tell your health care provider: ? About any new moles or changes in moles, especially if there is a change in a mole's shape or color. ? If you have a mole that is larger than the size of a pencil eraser.  If any of your family members has a history of skin cancer, especially at a young age, talk with your health care provider about genetic screening.  Always use sunscreen. Apply sunscreen liberally and repeatedly throughout the day.  Whenever you are outside, protect yourself by wearing long sleeves, pants, a wide-brimmed hat, and sunglasses.  What should I know about osteoporosis? Osteoporosis is a condition in which bone destruction happens more quickly than new bone creation. After menopause, you may be at an increased risk for osteoporosis. To help prevent osteoporosis or the bone fractures that can happen because of osteoporosis, the following is recommended:  If you are 46-71 years old, get at least 1,000 mg of calcium and at least 600 mg of vitamin D per day.  If you are older than age 55 but younger than age 65, get at least 1,200 mg of calcium and at least 600 mg of vitamin D per day.  If you are older than age 54, get at least 1,200 mg of calcium and at least 800 mg of vitamin D per day.  Smoking and excessive alcohol intake increase the risk of osteoporosis. Eat foods that are rich in calcium and vitamin D, and do weight-bearing exercises several times each week as directed by your health care provider. What should I know about how menopause affects my mental health? Depression may occur at any age, but it is more common as you become older. Common symptoms of depression  include:  Low or sad mood.  Changes in sleep patterns.  Changes in appetite or eating patterns.  Feeling an overall lack of motivation or enjoyment of activities that you previously enjoyed.  Frequent crying spells.  Talk with your health care provider if you think that you are experiencing depression. What should I know about immunizations? It is important that you get and maintain your immunizations. These include:  Tetanus, diphtheria, and pertussis (Tdap) booster vaccine.  Influenza every year before the flu season begins.  Pneumonia vaccine.  Shingles vaccine.  Your health care provider may also recommend other immunizations. This information is not intended to replace advice given to you by your health care provider. Make sure you discuss any questions you have with your health care provider. Document Released: 12/17/2005  Document Revised: 05/14/2016 Document Reviewed: 07/29/2015 Elsevier Interactive Patient Education  2018 [employer].  Preventing Unhealthy [employer], Adult Staying at a healthy weight is important. When fat builds up in your body, you may become overweight or obese. These conditions put you at greater risk for developing certain health problems, such as heart disease, diabetes, sleeping problems, joint problems, and some cancers. Unhealthy weight gain is often the result of making unhealthy choices in what you eat. It is also a result of not getting enough exercise. You can make changes to your lifestyle to prevent obesity and stay as healthy as possible. What nutrition changes can be made? To maintain a healthy weight and prevent obesity:  Eat only as much as your body needs. To do this: ? Pay attention to signs that you are hungry or full. Stop eating as soon as you feel full. ? If you feel hungry, try drinking water first. Drink enough water so your urine is clear or pale yellow. ? Eat smaller portions. ? Look at serving sizes on food labels. Most  foods contain more than one serving per container. ? Eat the recommended amount of calories for your gender and activity level. While most active people should eat around 2,000 calories per day, if you are trying to lose weight or are not very active, you main need to eat less calories. Talk to your health care provider or dietitian about how many calories you should eat each day.  Choose healthy foods, such as: ? Fruits and vegetables. Try to fill at least half of your plate at each meal with fruits and vegetables. ? Whole grains, such as whole wheat bread, brown rice, and quinoa. ? Lean meats, such as chicken or fish. ? Other healthy proteins, such as beans, eggs, or tofu. ? Healthy fats, such as nuts, seeds, fatty fish, and olive oil. ? Low-fat or fat-free dairy.  Check food labels and avoid food and drinks that: ? Are high in calories. ? Have added sugar. ? Are high in sodium. ? Have saturated fats or trans fats.  Limit how much you eat of the following foods: ? Prepackaged meals. ? Fast food. ? Fried foods. ? Processed meat, such as bacon, sausage, and deli meats. ? Fatty cuts of red meat and poultry with skin.  Cook foods in healthier ways, such as by baking, broiling, or grilling.  When grocery shopping, try to shop around the outside of the store. This helps you buy mostly fresh foods and avoid canned and prepackaged foods.  What lifestyle changes can be made?  Exercise at least 30 minutes 5 or more days each week. Exercising includes brisk walking, yard work, biking, running, swimming, and team sports like basketball and soccer. Ask your health care provider which exercises are safe for you.  Do not use any products that contain nicotine or tobacco, such as cigarettes and e-cigarettes. If you need help quitting, ask your health care provider.  Limit alcohol intake to no more than 1 drink a day for nonpregnant women and 2 drinks a day for men. One drink equals 12 oz of beer,  5 oz of wine, or 1 oz of hard liquor.  Try to get 7-9 hours of sleep each night. What other changes can be made?  Keep a food and activity journal to keep track of: ? What you ate and how many calories you had. Remember to count sauces, dressings, and side dishes. ? Whether you were active, and what exercises you  did. ? Your calorie, weight, and activity goals.  Check your weight regularly. Track any changes. If you notice you have gained weight, make changes to your diet or activity routine.  Avoid taking weight-loss medicines or supplements. Talk to your health care provider before starting any new medicine or supplement.  Talk to your health care provider before trying any new diet or exercise plan. Why are these changes important? Eating healthy, staying active, and having healthy habits not only help prevent obesity, they also:  Help you to manage stress and emotions.  Help you to connect with friends and family.  Improve your self-esteem.  Improve your sleep.  Prevent long-term health problems.  What can happen if changes are not made? Being obese or overweight can cause you to develop joint or bone problems, which can make it hard for you to stay active or do activities you enjoy. Being obese or overweight also puts stress on your heart and lungs and can lead to health problems like diabetes, heart disease, and some cancers. Where to find more information: Talk with your health care provider or a dietitian about healthy eating and healthy lifestyle choices. You may also find other information through these resources:  [place] Department of Agriculture MyPlate: FormerBoss.no  American Heart Association: www.heart.org  Centers for Disease Control and Prevention: http://www.wolf.info/  Summary  Staying at a healthy weight is important. It helps prevent certain diseases and health problems, such as heart disease, diabetes, joint problems, sleep disorders, and some  cancers.  Being obese or overweight can cause you to develop joint or bone problems, which can make it hard for you to stay active or do activities you enjoy.  You can prevent unhealthy weight gain by eating a healthy diet, exercising regularly, not smoking, limiting alcohol, and getting enough sleep.  Talk with your health care provider or a dietitian for guidance about healthy eating and healthy lifestyle choices. This information is not intended to replace advice given to you by your health care provider. Make sure you discuss any questions you have with your health care provider. Document Released: 10/26/2016 Document Revised: 12/01/2016 Document Reviewed: 12/01/2016 Elsevier Interactive Patient Education  2018 [employer].  Menopause and Hormone Replacement Therapy What is hormone replacement therapy? Hormone replacement therapy (HRT) is the use of artificial (synthetic) hormones to replace hormones that your body stops producing during menopause. Menopause is the normal time of life when menstrual periods stop completely and the ovaries stop producing the female hormones estrogen and progesterone. This lack of hormones can affect your health and cause undesirable symptoms. HRT can relieve some of those symptoms. What are my options for HRT? HRT may consist of the synthetic hormones estrogen and progestin, or it may consist of only estrogen (estrogen-only therapy). You and your health care provider will decide which form of HRT is best for you. If you choose to be on HRT and you have a uterus, estrogen and progestin are usually prescribed. Estrogen-only therapy is used for women who do not have a uterus. Possible options for taking HRT include:  Pills.  Patches.  Gels.  Sprays.  Vaginal cream.  Vaginal rings.  Vaginal inserts.  The amount of hormone(s) that you take and how long you take the hormone(s) varies depending on your individual health. It is important to:  Begin  HRT with the lowest possible dosage.  Stop HRT as soon as your health care provider tells you to stop.  Work with your health care provider so that  you feel informed and comfortable with your decisions.  What are the benefits of HRT? HRT can reduce the frequency and severity of menopausal symptoms. Benefits of HRT vary depending on the menopausal symptoms that you have, the severity of your symptoms, and your overall health. HRT may help to improve the following menopausal symptoms:  Hot flashes and night sweats. These are sudden feelings of heat that spread over the face and body. The skin may turn red, like a blush. Night sweats are hot flashes that happen while you are sleeping or trying to sleep.  Bone loss (osteoporosis). The body loses calcium more quickly after menopause, causing the bones to become weaker. This can increase the risk for bone breaks (fractures).  Vaginal dryness. The lining of the vagina can become thin and dry, which can cause pain during sexual intercourse or cause infection, burning, or itching.  Urinary tract infections.  Urinary incontinence. This is a decreased ability to control when you urinate.  Irritability.  Short-term memory problems.  What are the risks of HRT? Risks of HRT vary depending on your individual health and medical history. Risks of HRT also depend on whether you receive both estrogen and progestin or you receive estrogen only.HRT may increase the risk of:  Spotting. This is when a small amount of bloodleaks from the vagina unexpectedly.  Endometrial cancer. This cancer is in the lining of the uterus (endometrium).  Breast cancer.  Increased density of breast tissue. This can make it harder to find breast cancer on a breast X-ray (mammogram).  Stroke.  Heart attack.  Blood clots.  Gallbladder disease.  Risks of HRT can increase if you have any of the following conditions:  Endometrial cancer.  Liver disease.  Heart  disease.  Breast cancer.  History of blood clots.  History of stroke.  How should I care for myself while I am on HRT?  Take over-the-counter and prescription medicines only as told by your health care provider.  Get mammograms, pelvic exams, and medical checkups as often as told by your health care provider.  Have Pap tests done as often as told by your health care provider. A Pap test is sometimes called a Pap smear. It is a screening test that is used to check for signs of cancer of the cervix and vagina. A Pap test can also identify the presence of infection or precancerous changes. Pap tests may be done: ? Every 3 years, starting at age 23. ? Every 5 years, starting after age 59, in combination with testing for human papillomavirus (HPV). ? More often or less often depending on other medical conditions you have, your age, and other risk factors.  It is your responsibility to get your Pap test results. Ask your health care provider or the department performing the test when your results will be ready.  Keep all follow-up visits as told by your health care provider. This is important. When should I seek medical care? Talk with your health care provider if:  You have any of these: ? Pain or swelling in your legs. ? Shortness of breath. ? Chest pain. ? Lumps or changes in your breasts or armpits. ? Slurred speech. ? Pain, burning, or bleeding when you urine.  You develop any of these: ? Unusual vaginal bleeding. ? Dizziness or headaches. ? Weakness or numbness in any part of your arms or legs. ? Pain in your abdomen.  This information is not intended to replace advice given to you by your health  care provider. Make sure you discuss any questions you have with your health care provider. Document Released: 07/24/2003 Document Revised: 09/21/2016 Document Reviewed: 04/28/2015 Elsevier Interactive Patient Education  2017 [employer].

## 2018-09-18 ENCOUNTER — Ambulatory Visit (INDEPENDENT_AMBULATORY_CARE_PROVIDER_SITE_OTHER): Payer: Medicaid Other | Admitting: Obstetrics and Gynecology

## 2018-09-18 ENCOUNTER — Encounter: Payer: Self-pay | Admitting: Obstetrics and Gynecology

## 2018-09-18 VITALS — BP 122/84 | HR 90 | Ht 64.5 in | Wt 182.0 lb

## 2018-09-18 DIAGNOSIS — Z23 Encounter for immunization: Secondary | ICD-10-CM | POA: Diagnosis not present

## 2018-09-18 NOTE — Progress Notes (Signed)
Pt presents today for 3rd Gardasil injection.  Gardasil given in left deltoid, no side effects noted. Follow up PRN  BP 122/84   Pulse 90   Ht 5' 4.5" (1.638 m)   Wt 182 lb (82.6 kg)   BMI 30.76 kg/m

## 2019-02-26 ENCOUNTER — Other Ambulatory Visit: Payer: Self-pay | Admitting: Family Medicine

## 2019-02-26 DIAGNOSIS — Z1231 Encounter for screening mammogram for malignant neoplasm of breast: Secondary | ICD-10-CM

## 2019-04-12 DIAGNOSIS — N899 Noninflammatory disorder of vagina, unspecified: Secondary | ICD-10-CM | POA: Diagnosis not present

## 2019-04-12 DIAGNOSIS — N760 Acute vaginitis: Secondary | ICD-10-CM | POA: Diagnosis not present

## 2019-04-12 DIAGNOSIS — N390 Urinary tract infection, site not specified: Secondary | ICD-10-CM | POA: Diagnosis not present

## 2019-04-17 ENCOUNTER — Encounter: Payer: Self-pay | Admitting: Obstetrics and Gynecology

## 2019-04-17 ENCOUNTER — Other Ambulatory Visit (HOSPITAL_COMMUNITY)
Admission: RE | Admit: 2019-04-17 | Discharge: 2019-04-17 | Disposition: A | Discharge status: Home / Self Care | Payer: Medicaid Other | Source: Ambulatory Visit | Attending: Obstetrics and Gynecology | Admitting: Obstetrics and Gynecology

## 2019-04-17 ENCOUNTER — Other Ambulatory Visit: Payer: Self-pay

## 2019-04-17 ENCOUNTER — Ambulatory Visit (INDEPENDENT_AMBULATORY_CARE_PROVIDER_SITE_OTHER): Payer: Medicaid Other | Admitting: Obstetrics and Gynecology

## 2019-04-17 VITALS — BP 111/82 | HR 99 | Ht 64.5 in | Wt 179.7 lb

## 2019-04-17 DIAGNOSIS — R635 Abnormal weight gain: Secondary | ICD-10-CM | POA: Diagnosis not present

## 2019-04-17 DIAGNOSIS — E669 Obesity, unspecified: Secondary | ICD-10-CM | POA: Diagnosis not present

## 2019-04-17 DIAGNOSIS — L292 Pruritus vulvae: Secondary | ICD-10-CM

## 2019-04-17 DIAGNOSIS — N951 Menopausal and female climacteric states: Secondary | ICD-10-CM | POA: Diagnosis not present

## 2019-04-17 DIAGNOSIS — N870 Mild cervical dysplasia: Secondary | ICD-10-CM | POA: Diagnosis not present

## 2019-04-17 DIAGNOSIS — Z01419 Encounter for gynecological examination (general) (routine) without abnormal findings: Secondary | ICD-10-CM | POA: Diagnosis not present

## 2019-04-17 DIAGNOSIS — N952 Postmenopausal atrophic vaginitis: Secondary | ICD-10-CM

## 2019-04-17 DIAGNOSIS — N390 Urinary tract infection, site not specified: Secondary | ICD-10-CM

## 2019-04-17 DIAGNOSIS — Z01411 Encounter for gynecological examination (general) (routine) with abnormal findings: Secondary | ICD-10-CM

## 2019-04-17 NOTE — Patient Instructions (Addendum)
Perimenopause  Perimenopause is the normal time of life before and after menstrual periods stop completely (menopause). Perimenopause can begin 2-8 years before menopause, and it usually lasts for 1 year after menopause. During perimenopause, the ovaries may or may not produce an egg. What are the causes? This condition is caused by a natural change in hormone levels that happens as you get older. What increases the risk? This condition is more likely to start at an earlier age if you have certain medical conditions or treatments, including:  A tumor of the pituitary gland in the brain.  A disease that affects the ovaries and hormone production.  Radiation treatment for cancer.  Certain cancer treatments, such as chemotherapy or hormone (anti-estrogen) therapy.  Heavy smoking and excessive alcohol use.  Family history of early menopause. What are the signs or symptoms? Perimenopausal changes affect each woman differently. Symptoms of this condition may include:  Hot flashes.  Night sweats.  Irregular menstrual periods.  Decreased sex drive.  Vaginal dryness.  Headaches.  Mood swings.  Depression.  Memory problems or trouble concentrating.  Irritability.  Tiredness.  Weight gain.  Anxiety.  Trouble getting pregnant. How is this diagnosed? This condition is diagnosed based on your medical history, a physical exam, your age, your menstrual history, and your symptoms. Hormone tests may also be done. How is this treated? In some cases, no treatment is needed. You and your health care provider should make a decision together about whether treatment is necessary. Treatment will be based on your individual condition and preferences. Various treatments are available, such as:  Menopausal hormone therapy (MHT).  Medicines to treat specific symptoms.  Acupuncture.  Vitamin or herbal supplements. Before starting treatment, make sure to let your health care provider  know if you have a personal or family history of:  Heart disease.  Breast cancer.  Blood clots.  Diabetes.  Osteoporosis. Follow these instructions at home: Lifestyle  Do not use any products that contain nicotine or tobacco, such as cigarettes and e-cigarettes. If you need help quitting, ask your health care provider.  Eat a balanced diet that includes fresh fruits and vegetables, whole grains, soybeans, eggs, lean meat, and low-fat dairy.  Get at least 30 minutes of physical activity on 5 or more days each week.  Avoid alcoholic and caffeinated beverages, as well as spicy foods. This may help prevent hot flashes.  Get 7-8 hours of sleep each night.  Dress in layers that can be removed to help you manage hot flashes.  Find ways to manage stress, such as deep breathing, meditation, or journaling. General instructions  Keep track of your menstrual periods, including: ? When they occur. ? How heavy they are and how long they last. ? How much time passes between periods.  Keep track of your symptoms, noting when they start, how often you have them, and how long they last.  Take over-the-counter and prescription medicines only as told by your health care provider.  Take vitamin supplements only as told by your health care provider. These may include calcium, vitamin E, and vitamin D.  Use vaginal lubricants or moisturizers to help with vaginal dryness and improve comfort during sex.  Talk with your health care provider before starting any herbal supplements.  Keep all follow-up visits as told by your health care provider. This is important. This includes any group therapy or counseling. Contact a health care provider if:  You have heavy vaginal bleeding or pass blood clots.  Your period  lasts more than 2 days longer than normal.  Your periods are recurring sooner than 21 days.  You bleed after having sex. Get help right away if:  You have chest pain, trouble  breathing, or trouble talking.  You have severe depression.  You have pain when you urinate.  You have severe headaches.  You have vision problems. Summary  Perimenopause is the time when a woman's body begins to move into menopause. This may happen naturally or as a result of other health problems or medical treatments.  Perimenopause can begin 2-8 years before menopause, and it usually lasts for 1 year after menopause.  Perimenopausal symptoms can be managed through medicines, lifestyle changes, and complementary therapies such as acupuncture. This information is not intended to replace advice given to you by your health care provider. Make sure you discuss any questions you have with your health care provider. Document Released: 12/02/2004 Document Revised: 11/30/2016 Document Reviewed: 11/30/2016 Elsevier Interactive Patient Education  2019 [place] Maintenance, Female Adopting a healthy lifestyle and getting preventive care can go a long way to promote health and wellness. Talk with your health care provider about what schedule of regular examinations is right for you. This is a good chance for you to check in with your provider about disease prevention and staying healthy. In between checkups, there are plenty of things you can do on your own. Experts have done a lot of research about which lifestyle changes and preventive measures are most likely to keep you healthy. Ask your health care provider for more information. Weight and diet Eat a healthy diet  Be sure to include plenty of vegetables, fruits, low-fat dairy products, and lean protein.  Do not eat a lot of foods high in solid fats, added sugars, or salt.  Get regular exercise. This is one of the most important things you can do for your health. ? Most adults should exercise for at least 150 minutes each week. The exercise should increase your heart rate and make you sweat (moderate-intensity  exercise). ? Most adults should also do strengthening exercises at least twice a week. This is in addition to the moderate-intensity exercise. Maintain a healthy weight  Body mass index (BMI) is a measurement that can be used to identify possible weight problems. It estimates body fat based on height and weight. Your health care provider can help determine your BMI and help you achieve or maintain a healthy weight.  For females 20 years of age and older: ? A BMI below 18.5 is considered underweight. ? A BMI of 18.5 to 24.9 is normal. ? A BMI of 25 to 29.9 is considered overweight. ? A BMI of 30 and above is considered obese. Watch levels of cholesterol and blood lipids  You should start having your blood tested for lipids and cholesterol at 43 years of age, then have this test every 5 years.  You may need to have your cholesterol levels checked more often if: ? Your lipid or cholesterol levels are high. ? You are older than 43 years of age. ? You are at high risk for heart disease. Cancer screening Lung Cancer  Lung cancer screening is recommended for adults 36-13 years old who are at high risk for lung cancer because of a history of smoking.  A yearly low-dose CT scan of the lungs is recommended for people who: ? Currently smoke. ? Have quit within the past 15 years. ? Have at least a 30-pack-year history of  smoking. A pack year is smoking an average of one pack of cigarettes a day for 1 year.  Yearly screening should continue until it has been 15 years since you quit.  Yearly screening should stop if you develop a health problem that would prevent you from having lung cancer treatment. Breast Cancer  Practice breast self-awareness. This means understanding how your breasts normally appear and feel.  It also means doing regular breast self-exams. Let your health care provider know about any changes, no matter how small.  If you are in your 20s or 30s, you should have a clinical  breast exam (CBE) by a health care provider every 1-3 years as part of a regular health exam.  If you are 32 or older, have a CBE every year. Also consider having a breast X-ray (mammogram) every year.  If you have a family history of breast cancer, talk to your health care provider about genetic screening.  If you are at high risk for breast cancer, talk to your health care provider about having an MRI and a mammogram every year.  Breast cancer gene (BRCA) assessment is recommended for women who have family members with BRCA-related cancers. BRCA-related cancers include: ? Breast. ? Ovarian. ? Tubal. ? Peritoneal cancers.  Results of the assessment will determine the need for genetic counseling and BRCA1 and BRCA2 testing. Cervical Cancer Your health care provider may recommend that you be screened regularly for cancer of the pelvic organs (ovaries, uterus, and vagina). This screening involves a pelvic examination, including checking for microscopic changes to the surface of your cervix (Pap test). You may be encouraged to have this screening done every 3 years, beginning at age 43.  For women ages 81-65, health care providers may recommend pelvic exams and Pap testing every 3 years, or they may recommend the Pap and pelvic exam, combined with testing for human papilloma virus (HPV), every 5 years. Some types of HPV increase your risk of cervical cancer. Testing for HPV may also be done on women of any age with unclear Pap test results.  Other health care providers may not recommend any screening for nonpregnant women who are considered low risk for pelvic cancer and who do not have symptoms. Ask your health care provider if a screening pelvic exam is right for you.  If you have had past treatment for cervical cancer or a condition that could lead to cancer, you need Pap tests and screening for cancer for at least 20 years after your treatment. If Pap tests have been discontinued, your risk  factors (such as having a new sexual partner) need to be reassessed to determine if screening should resume. Some women have medical problems that increase the chance of getting cervical cancer. In these cases, your health care provider may recommend more frequent screening and Pap tests. Colorectal Cancer  This type of cancer can be detected and often prevented.  Routine colorectal cancer screening usually begins at 43 years of age and continues through 43 years of age.  Your health care provider may recommend screening at an earlier age if you have risk factors for colon cancer.  Your health care provider may also recommend using home test kits to check for hidden blood in the stool.  A small camera at the end of a tube can be used to examine your colon directly (sigmoidoscopy or colonoscopy). This is done to check for the earliest forms of colorectal cancer.  Routine screening usually begins at age 55.  Direct examination of the colon should be repeated every 5-10 years through 43 years of age. However, you may need to be screened more often if early forms of precancerous polyps or small growths are found. Skin Cancer  Check your skin from head to toe regularly.  Tell your health care provider about any new moles or changes in moles, especially if there is a change in a mole's shape or color.  Also tell your health care provider if you have a mole that is larger than the size of a pencil eraser.  Always use sunscreen. Apply sunscreen liberally and repeatedly throughout the day.  Protect yourself by wearing long sleeves, pants, a wide-brimmed hat, and sunglasses whenever you are outside. Heart disease, diabetes, and high blood pressure  High blood pressure causes heart disease and increases the risk of stroke. High blood pressure is more likely to develop in: ? People who have blood pressure in the high end of the normal range (130-139/85-89 mm Hg). ? People who are overweight or  obese. ? People who are African American.  If you are 30-31 years of age, have your blood pressure checked every 3-5 years. If you are 34 years of age or older, have your blood pressure checked every year. You should have your blood pressure measured twice-once when you are at a hospital or clinic, and once when you are not at a hospital or clinic. Record the average of the two measurements. To check your blood pressure when you are not at a hospital or clinic, you can use: ? An automated blood pressure machine at a pharmacy. ? A home blood pressure monitor.  If you are between 38 years and 59 years old, ask your health care provider if you should take aspirin to prevent strokes.  Have regular diabetes screenings. This involves taking a blood sample to check your fasting blood sugar level. ? If you are at a normal weight and have a low risk for diabetes, have this test once every three years after 43 years of age. ? If you are overweight and have a high risk for diabetes, consider being tested at a younger age or more often. Preventing infection Hepatitis B  If you have a higher risk for hepatitis B, you should be screened for this virus. You are considered at high risk for hepatitis B if: ? You were born in a country where hepatitis B is common. Ask your health care provider which countries are considered high risk. ? Your parents were born in a high-risk country, and you have not been immunized against hepatitis B (hepatitis B vaccine). ? You have HIV or AIDS. ? You use needles to inject street drugs. ? You live with someone who has hepatitis B. ? You have had sex with someone who has hepatitis B. ? You get hemodialysis treatment. ? You take certain medicines for conditions, including cancer, organ transplantation, and autoimmune conditions. Hepatitis C  Blood testing is recommended for: ? Everyone born from [birth year] through 1965. ? Anyone with known risk factors for hepatitis C. Sexually  transmitted infections (STIs)  You should be screened for sexually transmitted infections (STIs) including gonorrhea and chlamydia if: ? You are sexually active and are younger than 43 years of age. ? You are older than 43 years of age and your health care provider tells you that you are at risk for this type of infection. ? Your sexual activity has changed since you were last screened and you are at an  increased risk for chlamydia or gonorrhea. Ask your health care provider if you are at risk.  If you do not have HIV, but are at risk, it may be recommended that you take a prescription medicine daily to prevent HIV infection. This is called pre-exposure prophylaxis (PrEP). You are considered at risk if: ? You are sexually active and do not regularly use condoms or know the HIV status of your partner(s). ? You take drugs by injection. ? You are sexually active with a partner who has HIV. Talk with your health care provider about whether you are at high risk of being infected with HIV. If you choose to begin PrEP, you should first be tested for HIV. You should then be tested every 3 months for as long as you are taking PrEP. Pregnancy  If you are premenopausal and you may become pregnant, ask your health care provider about preconception counseling.  If you may become pregnant, take 400 to 800 micrograms (mcg) of folic acid every day.  If you want to prevent pregnancy, talk to your health care provider about birth control (contraception). Osteoporosis and menopause  Osteoporosis is a disease in which the bones lose minerals and strength with aging. This can result in serious bone fractures. Your risk for osteoporosis can be identified using a bone density scan.  If you are 8 years of age or older, or if you are at risk for osteoporosis and fractures, ask your health care provider if you should be screened.  Ask your health care provider whether you should take a calcium or vitamin D  supplement to lower your risk for osteoporosis.  Menopause may have certain physical symptoms and risks.  Hormone replacement therapy may reduce some of these symptoms and risks. Talk to your health care provider about whether hormone replacement therapy is right for you. Follow these instructions at home:  Schedule regular health, dental, and eye exams.  Stay current with your immunizations.  Do not use any tobacco products including cigarettes, chewing tobacco, or electronic cigarettes.  If you are pregnant, do not drink alcohol.  If you are breastfeeding, limit how much and how often you drink alcohol.  Limit alcohol intake to no more than 1 drink per day for nonpregnant women. One drink equals 12 ounces of beer, 5 ounces of wine, or 1 ounces of hard liquor.  Do not use street drugs.  Do not share needles.  Ask your health care provider for help if you need support or information about quitting drugs.  Tell your health care provider if you often feel depressed.  Tell your health care provider if you have ever been abused or do not feel safe at home. This information is not intended to replace advice given to you by your health care provider. Make sure you discuss any questions you have with your health care provider. Document Released: 05/10/2011 Document Revised: 04/01/2016 Document Reviewed: 07/29/2015 Elsevier Interactive Patient Education  2019 [employer].

## 2019-04-17 NOTE — Progress Notes (Signed)
GYNECOLOGY ANNUAL PHYSICAL EXAM PROGRESS NOTE  Subjective:    Natalie [name] is a 42 y.o. postmenopausal [phone] female who presents for an annual exam.  The patient is sexually active. The patient has been on hormone replacement therapy in the past (for ~ 2-3 months) but began noting side effects (restarted her periods, PMB?). The patient wears seatbelts: yes. The patient participates in regular exercise: is just initiating an exercise program at the gym.Marland Kitchen Has the patient ever been transfused or tattooed?: no. The patient reports that there is not domestic violence in her life.   The patient has the following complaints today: 1.  The patient notes that she is still having "periods" every month or every other month.  Is wondering if she is really in menopause.  Last cycle was 04/06/2019.  2. Notes her hot flashes are still ongoing.  Has discontinued  3. Patient notes that she feels that she is having recurrent bladder infections. Currently on Bactrim.  Also unsure if she has a yeast infection as she has external vulvar itching/burning, but denies internal vaginal itching. Also having more hair bumps after shaving.  4. Patient reports that she is noting that she is gaining weight. Has gained 10 lbs probably over the past year.  Also notes fatigue. States she was by her PCP several months ago and was told her thyroid levels were borderline.   Gynecologic History Menarche age: 28 No LMP recorded. Patient is menopausal. Contraception: post menopausal status History of STI's: H/o HPV Last Pap: 10/2017. Results were: abnormal (NILM but + HR HPV) with CIN I on colposcopy in 01/2018.  Reports remote h/o mildly abnormal pap smear in the past. Last mammogram: 01/2018. Results were: normal.  PCP: Dr. Lorelee Market   OB History  Gravida Para Term Preterm AB Living  5 3 2 1 2 2   SAB TAB Ectopic Multiple Live Births  2 0 0 0 3    # Outcome Date GA Lbr Len/2nd Weight Sex Delivery Anes PTL Lv   5 Term         LIV  4 SAB           3 SAB           2 Term         ND  1 Preterm     F Vag-Spont   LIV    Past Medical History:  Diagnosis Date  . Dysplasia of cervix, low grade (CIN 1) 02/15/2017   Will need repeat pap smear in 1 year.   Marland Kitchen History of Graves' disease   . Hyperlipemia   . Hypertension   . Ovarian cyst   . Vitamin D deficiency     Past Surgical History:  Procedure Laterality Date  . DILATION AND CURETTAGE OF UTERUS    . TONSILECTOMY/ADENOIDECTOMY WITH MYRINGOTOMY    . TUBAL LIGATION      Family History  Problem Relation Age of Onset  . Hyperlipidemia Mother   . Hyperthyroidism Mother   . Thyroid cancer Father   . Marfan syndrome Father   . Marfan syndrome Sister   . Depression Sister   . Depression Brother     Social History   Socioeconomic History  . Marital status: Married    Spouse name: Not on file  . Number of children: Not on file  . Years of education: Not on file  . Highest education level: Not on file  Occupational History  . Not on file  Social  Needs  . Financial resource strain: Not on file  . Food insecurity:    Worry: Not on file    Inability: Not on file  . Transportation needs:    Medical: Not on file    Non-medical: Not on file  Tobacco Use  . Smoking status: Former Smoker    Packs/day: 0.50    Years: 10.00    Pack years: 5.00    Types: Cigarettes    Last attempt to quit: 12/12/2016    Years since quitting: 2.3  . Smokeless tobacco: Never Used  Substance and Sexual Activity  . Alcohol use: No    Alcohol/week: 0.0 standard drinks  . Drug use: No  . Sexual activity: Yes    Birth control/protection: None  Lifestyle  . Physical activity:    Days per week: Not on file    Minutes per session: Not on file  . Stress: Not on file  Relationships  . Social connections:    Talks on phone: Not on file    Gets together: Not on file    Attends religious service: Not on file    Active member of club or organization: Not on  file    Attends meetings of clubs or organizations: Not on file    Relationship status: Not on file  . Intimate partner violence:    Fear of current or ex partner: Not on file    Emotionally abused: Not on file    Physically abused: Not on file    Forced sexual activity: Not on file  Other Topics Concern  . Not on file  Social History Narrative  . Not on file    Current Outpatient Medications on File Prior to Visit  Medication Sig Dispense Refill  . atorvastatin (LIPITOR) 40 MG tablet Take by mouth.    Marland Kitchen buPROPion (WELLBUTRIN XL) 300 MG 24 hr tablet Take by mouth.    . busPIRone (BUSPAR) 5 MG tablet Take 5 mg by mouth 2 (two) times daily.    Marland Kitchen estrogen, conjugated,-medroxyprogesterone (PREMPRO) 0.625-2.5 MG tablet Take 1 tablet by mouth daily. 30 tablet 6  . Omega-3 Fatty Acids (FISH OIL) 1000 MG CAPS Take by mouth.    . pantoprazole (PROTONIX) 40 MG tablet Take 40 mg by mouth daily.  5  . sulfamethoxazole-trimethoprim (BACTRIM DS) 800-160 MG tablet Take 1 tablet by mouth 2 (two) times daily.    . Vitamin D, Ergocalciferol, (DRISDOL) 50000 UNITS CAPS capsule Take 1 capsule (50,000 Units total) by mouth once a week. 12 capsule 0  . meclizine (ANTIVERT) 25 MG tablet Take 25 mg by mouth 3 (three) times daily as needed.  2  . mometasone (ELOCON) 0.1 % ointment 1 APPLICATION TOPICAL EVERY DAY  1   No current facility-administered medications on file prior to visit.     Allergies  Allergen Reactions  . Hydrocodone-Acetaminophen Itching  . Oxycodone-Acetaminophen Other (See Comments) and Itching     Review of Systems Constitutional: negative for chills, fatigue, fevers and sweats.  Positive for weight gain Eyes: negative for irritation, redness and visual disturbance Ears, nose, mouth, throat, and face: negative for hearing loss, nasal congestion, snoring and tinnitus Respiratory: negative for asthma, cough, sputum Cardiovascular: negative for chest pain, dyspnea, exertional chest  pressure/discomfort, irregular heart beat, palpitations and syncope Gastrointestinal: negative for abdominal pain, change in bowel habits, nausea and vomiting Genitourinary: positive for abnormal menstrual periods (patient notes she was told she was in menopause), and vulvar itching.  Negative for genital lesions,  sexual problems and vaginal discharge, dysuria and urinary incontinence Integument/breast: negative for breast lump, breast tenderness and nipple discharge Hematologic/lymphatic: negative for bleeding and easy bruising Musculoskeletal:negative for back pain and muscle weakness Neurological: negative for dizziness, headaches, vertigo and weakness Endocrine: negative for diabetic symptoms including polydipsia, polyuria and skin dryness Allergic/Immunologic: negative for hay fever and urticaria      Objective:  Blood pressure 111/82, pulse 99, height 5' 4.5" (1.638 m), weight 179 lb 11.2 oz (81.5 kg), last menstrual period 04/06/2019. Body mass index is 30.37 kg/m.  General Appearance:    Alert, cooperative, no distress, appears stated age  Head:    Normocephalic, without obvious abnormality, atraumatic  Eyes:    PERRL, conjunctiva/corneas clear, EOM's intact, both eyes  Ears:    Normal external ear canals, both ears  Nose:   Nares normal, septum midline, mucosa normal, no drainage or sinus tenderness  Throat:   Lips, mucosa, and tongue normal; teeth and gums normal  Neck:   Supple, symmetrical, trachea midline, no adenopathy; thyroid: no enlargement/tenderness/nodules; no carotid bruit or JVD  Back:     Symmetric, no curvature, ROM normal, no CVA tenderness  Lungs:     Clear to auscultation bilaterally, respirations unlabored  Chest Wall:    No tenderness or deformity   Heart:    Regular rate and rhythm, S1 and S2 normal, no murmur, rub or gallop  Breast Exam:    No tenderness, masses, or nipple abnormality  Abdomen:     Soft, non-tender, bowel sounds active all four quadrants, no  masses, no organomegaly.    Genitalia:    Pelvic:external genitalia normal, vagina without lesions, scant thin white discharge, no odor. Mild vaginal/vulvar atrophy present. Rectovaginal septum  normal. Cervix normal in appearance, no cervical motion tenderness, no adnexal masses or tenderness.  Uterus normal size, shape, mobile, regular contours, nontender.  Rectal:    Normal external sphincter.  No hemorrhoids appreciated. Internal exam not done.   Extremities:   Extremities normal, atraumatic, no cyanosis or edema  Pulses:   2+ and symmetric all extremities  Skin:   Skin color, texture, turgor normal, no rashes or lesions  Lymph nodes:   Cervical, supraclavicular, and axillary nodes normal  Neurologic:   CNII-XII intact, normal strength, sensation and reflexes throughout   .  Labs:  No results found for: WBC, HGB, HCT, MCV, PLT  No results found for: CREATININE, BUN, NA, K, CL, CO2  No results found for: ALT, AST, GGT, ALKPHOS, BILITOT  No results found for: TSH   Assessment:   Encounter for gynecological examination (general) (routine) with abnormal findings  Hot flushes, perimenopausal  Weight gain CIN I (cervical intraepithelial neoplasia I) Obesity (BMI 30.0-34.9) Recurrent UTI (urinary tract infection) Vulvar itching Vaginal atrophy   Plan:    - Blood tests: Patient has had annual labs by her PCP several months ago.  Notes that she was told her thyroid levels were borderline, but no further management was indicated.  Will redraw hormone levels to assess if patient is truly in menopausal or is perimenopausal.  If menopausal, then bleeding is abnormal and she will need further workup.  - Breast self exam technique reviewed and patient encouraged to perform self-exam monthly. - Contraception: post menopausal status? - Discussed healthy lifestyle modifications. - Discussed herbal/OTC remedies for weight management. Also counseled on dietary modification and encouraged on  exercise. Weight may also be due to perimenopause/menopausal symptoms or thyroid abnormalities.  - Mammogram due.  Is scheduled for  tomorrow.  - Pap smear performed today in light of history of abnormal pap smear with CIN I.  - Patient with bothersome menopausal vasomotor symptoms. Reiterated lifestyle interventions such as wearing light clothing, remaining in cool environments, having fan/air conditioner in the room, avoiding hot beverages etc. Was taking Prempro, but discontinued "a while ago" due to ineffectiveness of medication.  Discussed using hormone therapy and concerns about increased risk of heart disease, cerebrovascular disease, thromboembolic disease, and breast cancer.  Also discussed other medical options such as Paxil, Effexor, Brisdelle, Clonidine,  or Neurontin.  Can offer patient a different HRT, a non-hormonal option, or herbal remedy.  Patient unsure, desires to think over options.  Given information.   - Vaginal atrophy noted on exam today. Given Premarin cream for external use.  May also help with patient's recurrent UTI's.  No infection noted today.  - Will notify patient of results by phone.  - RTC in 1 year, or sooner for any gynecologic concerns.    Rubie Maid, MD [facility]

## 2019-04-17 NOTE — Progress Notes (Signed)
Pt is present today for her annual exam. Pt's last pap was 11/03/2016. Pt's last mammogram was 01/23/18 and she is scheduled to get one done tomorrow. Pt stated that is having some burning on the outside of the vaginal area and continues to get bladder infections. Pt is currently taking Bactrim DM for a bladder infection.

## 2019-04-18 ENCOUNTER — Ambulatory Visit
Admission: RE | Admit: 2019-04-18 | Discharge: 2019-04-18 | Disposition: A | Discharge status: Home / Self Care | Payer: Medicaid Other | Source: Ambulatory Visit | Attending: Family Medicine | Admitting: Family Medicine

## 2019-04-18 DIAGNOSIS — Z1231 Encounter for screening mammogram for malignant neoplasm of breast: Secondary | ICD-10-CM

## 2019-04-18 LAB — FSH/LH
FSH: 64 m[IU]/mL
LH: 46.7 m[IU]/mL

## 2019-04-18 LAB — PROGESTERONE: Progesterone: 0.5 ng/mL

## 2019-04-18 LAB — ESTRADIOL: Estradiol: 10.7 pg/mL

## 2019-04-19 ENCOUNTER — Telehealth: Payer: Self-pay | Admitting: Obstetrics and Gynecology

## 2019-04-19 LAB — CYTOLOGY - PAP
Diagnosis: NEGATIVE
HPV: NOT DETECTED

## 2019-04-19 NOTE — Telephone Encounter (Signed)
The patient called and stated that she has a few questions to ask in regards to her results she recently received. Please advise.

## 2019-04-20 ENCOUNTER — Other Ambulatory Visit: Payer: Self-pay

## 2019-04-20 MED ORDER — BIJUVA 1-100 MG PO CAPS: 100.0000 mg | ORAL_CAPSULE | Freq: Every day | ORAL | 11 refills | Status: DC

## 2019-04-20 NOTE — Telephone Encounter (Signed)
Pt called no answer LM via voicemail to call the office to go over test results and information left by [facility].

## 2019-04-23 ENCOUNTER — Telehealth: Payer: Self-pay | Admitting: Obstetrics and Gynecology

## 2019-04-23 NOTE — Telephone Encounter (Signed)
The patient called and stated that her insurance will only cover her prescriptions if they are sent in separately. Pt is requesting to speak w/ nurse. Please advise.

## 2019-04-24 NOTE — Telephone Encounter (Signed)
Pt called no answer LM information pt that I had spoke with the pharmacy and her medication is not covered by her insurance and that a message would be sent to [place] her so changes could be made to her medication.

## 2019-04-24 NOTE — Telephone Encounter (Signed)
Please see another phone encounter.

## 2019-04-24 NOTE — Telephone Encounter (Signed)
Called pharmacy to speak to someone concerning the pt's call to the office. Was informed that her insurance would not cover the [place].

## 2019-04-24 NOTE — Telephone Encounter (Signed)
Pt called and informed that I had spoke to the pharmacy and they stated that her medication was not cover by her insurance. Pt was informed as soon as AC prescribe her some medication to replace the one that was not covered I would send her a mychart message stating the name of the medication and information concerning how to take the medication. Pt stated that she understood.

## 2019-04-25 DIAGNOSIS — G43909 Migraine, unspecified, not intractable, without status migrainosus: Secondary | ICD-10-CM | POA: Diagnosis not present

## 2019-04-25 DIAGNOSIS — E785 Hyperlipidemia, unspecified: Secondary | ICD-10-CM | POA: Diagnosis not present

## 2019-04-25 DIAGNOSIS — E039 Hypothyroidism, unspecified: Secondary | ICD-10-CM | POA: Diagnosis not present

## 2019-04-25 DIAGNOSIS — M545 Low back pain: Secondary | ICD-10-CM | POA: Diagnosis not present

## 2019-04-27 ENCOUNTER — Telehealth: Payer: Self-pay | Admitting: Obstetrics and Gynecology

## 2019-04-27 NOTE — Telephone Encounter (Signed)
The patient called and stated that she is out of samples of a cream Dr. Marcelline Mates gave her. And wants to know if she is able to be given more. Please advise.

## 2019-04-27 NOTE — Telephone Encounter (Signed)
Spoke with pt and gave her more samples of the premarin cream. Pt stated that one tube did not have a lot in it.

## 2019-05-01 ENCOUNTER — Telehealth: Payer: Self-pay

## 2019-05-01 NOTE — Telephone Encounter (Signed)
Coronavirus (COVID-19) Are you at risk?  Are you at risk for the Coronavirus (COVID-19)?  To be considered HIGH RISK for Coronavirus (COVID-19), you have to meet the following criteria:  . Traveled to [place], [place], [place], [place] or [place]; or in the [place] to [place], [place], [place], or [place]; and have fever, cough, and shortness of breath within the last 2 weeks of travel OR . Been in close contact with a person diagnosed with COVID-19 within the last 2 weeks and have fever, cough, and shortness of breath . IF YOU DO NOT MEET THESE CRITERIA, YOU ARE CONSIDERED LOW RISK FOR COVID-19.  What to do if you are HIGH RISK for COVID-19?  . If you are having a medical emergency, call 911. . Seek medical care right away. Before you go to a doctor's office, urgent care or emergency department, call ahead and tell them about your recent travel, contact with someone diagnosed with COVID-19, and your symptoms. You should receive instructions from your physician's office regarding next steps of care.  . When you arrive at healthcare provider, tell the healthcare staff immediately you have returned from visiting [place], [place], [place], [place] or [place]; or traveled in the [place] to [place], [place], [place], or [place]; in the last two weeks or you have been in close contact with a person diagnosed with COVID-19 in the last 2 weeks.   . Tell the health care staff about your symptoms: fever, cough and shortness of breath. . After you have been seen by a medical provider, you will be either: o Tested for (COVID-19) and discharged home on quarantine except to seek medical care if symptoms worsen, and asked to  - Stay home and avoid contact with others until you get your results (4-5 days)  - Avoid travel on public transportation if possible (such as bus, train, or airplane) or o Sent to the Emergency Department by EMS for evaluation, COVID-19 testing, and possible  admission depending on your condition and test results.  What to do if you are LOW RISK for COVID-19?  Reduce your risk of any infection by using the same precautions used for avoiding the common cold or flu:  . Wash your hands often with soap and warm water for at least 20 seconds.  If soap and water are not readily available, use an alcohol-based hand sanitizer with at least 60% alcohol.  . If coughing or sneezing, cover your mouth and nose by coughing or sneezing into the elbow areas of your shirt or coat, into a tissue or into your sleeve (not your hands). . Avoid shaking hands with others and consider head nods or verbal greetings only. . Avoid touching your eyes, nose, or mouth with unwashed hands.  . Avoid close contact with people who are sick. . Avoid places or events with large numbers of people in one location, like concerts or sporting events. . Carefully consider travel plans you have or are making. . If you are planning any travel outside or inside the [place], visit the CDC's Travelers' Health webpage for the latest health notices. . If you have some symptoms but not all symptoms, continue to monitor at home and seek medical attention if your symptoms worsen. . If you are having a medical emergency, call 911.   ADDITIONAL HEALTHCARE OPTIONS FOR PATIENTS  [facility] / e-Visit: [email or web address]         [facility]: [phone]  [place]   [facility]: [facility]: [phone]   Prescreened. Neg .cm

## 2019-05-02 ENCOUNTER — Other Ambulatory Visit: Payer: Self-pay

## 2019-05-02 ENCOUNTER — Ambulatory Visit (INDEPENDENT_AMBULATORY_CARE_PROVIDER_SITE_OTHER): Payer: Medicaid Other | Admitting: Obstetrics and Gynecology

## 2019-05-02 ENCOUNTER — Encounter: Payer: Self-pay | Admitting: Obstetrics and Gynecology

## 2019-05-02 ENCOUNTER — Other Ambulatory Visit: Payer: Self-pay | Admitting: Obstetrics and Gynecology

## 2019-05-02 ENCOUNTER — Other Ambulatory Visit (HOSPITAL_COMMUNITY)
Admission: RE | Admit: 2019-05-02 | Discharge: 2019-05-02 | Disposition: A | Discharge status: Home / Self Care | Payer: Medicaid Other | Source: Ambulatory Visit | Attending: Obstetrics and Gynecology | Admitting: Obstetrics and Gynecology

## 2019-05-02 ENCOUNTER — Ambulatory Visit (INDEPENDENT_AMBULATORY_CARE_PROVIDER_SITE_OTHER): Payer: Medicaid Other

## 2019-05-02 VITALS — BP 110/76 | HR 70 | Ht 64.5 in | Wt 176.9 lb

## 2019-05-02 DIAGNOSIS — Z79890 Hormone replacement therapy (postmenopausal): Secondary | ICD-10-CM

## 2019-05-02 DIAGNOSIS — N924 Excessive bleeding in the premenopausal period: Secondary | ICD-10-CM | POA: Diagnosis not present

## 2019-05-02 DIAGNOSIS — N858 Other specified noninflammatory disorders of uterus: Secondary | ICD-10-CM | POA: Diagnosis not present

## 2019-05-02 DIAGNOSIS — N950 Postmenopausal bleeding: Secondary | ICD-10-CM | POA: Diagnosis not present

## 2019-05-02 DIAGNOSIS — N951 Menopausal and female climacteric states: Secondary | ICD-10-CM | POA: Diagnosis not present

## 2019-05-02 DIAGNOSIS — N870 Mild cervical dysplasia: Secondary | ICD-10-CM | POA: Insufficient documentation

## 2019-05-02 DIAGNOSIS — N952 Postmenopausal atrophic vaginitis: Secondary | ICD-10-CM

## 2019-05-02 NOTE — Progress Notes (Signed)
    GYNECOLOGY PROGRESS NOTE  Subjective:    Patient ID: Natalie [name], female    DOB: 26-Jul-1976, 43 y.o.   MRN: [id number]  HPI  Patient is a 43 y.o. [id number] female who presents for follow up of PMB.  Has had labs confirming menopause, but was beginning to have cycles again on HRT (placed on due to significant vasomotor symptoms).  Returns today for further evaluation with endometrial biopsy and ultrasound. Denies complaints today. Has been using vaginal premarin cream for mild atrophy.   The following portions of the patient's history were reviewed and updated as appropriate: allergies, current medications, past family history, past medical history, past social history, past surgical history and problem list.  Review of Systems Pertinent items noted in HPI and remainder of comprehensive ROS otherwise negative.   Objective:   Blood pressure 110/76, pulse 70, height 5' 4.5" (1.638 m), weight 176 lb 14.4 oz (80.2 kg), last menstrual period 04/06/2019. General appearance: alert and no distress Abdomen: soft, non-tender; bowel sounds normal; no masses,  no organomegaly Pelvic: external genitalia normal, rectovaginal septum normal.  Vagina without discharge.  Cervix normal appearing, no lesions and no motion tenderness.  Uterus mobile, nontender, normal shape and size.  Adnexae non-palpable, nontender bilaterally.  Extremities: extremities normal, atraumatic, no cyanosis or edema Neurologic: Grossly normal   Assessment:   Menopausal bleeding Hormonal replacement therapy Vasomotor syndrome  Plan:   - Patient with bothersome vasomotor symptoms.  Has tried Prempro without relief, and also began resuming vaginal bleeding (possibly cycles).  Attempted to prescribe Bijuva (bio-idential HRT, however insurance did not cover). Discussed options for management of vasomotor symptoms, including use of Premarin without the progesterone (21 day on/7 day off cycle) or use of non-hormonal options such  as Brisdelle (paxil).  Patient would like to try the Premarin tablets.  Given samples (2 week supply). If symptoms improve, will prescribe. If not, then will send in Paxil prescription.  - Advised that if she begins the Premarin tablets, can decrease the cream use to twice weekly, and then after 1 month, can discontinue use completely. If she starts Paxil, should remain on the Premarin cream twice weekly.  - Endometrial biopsy performed today in light of PMB. Ultrasound reviewed with patient, appears overall normal.  - Patient will need to f/u in 6-8 weeks after initiation of medication.    Endometrial Biopsy Procedure Note  The patient is positioned on the exam table in the dorsal lithotomy position. Bimanual exam confirms uterine position and size. A Graves speculum is placed into the vagina. A single toothed tenaculum is placed onto the anterior lip of the cervix. The pipette is placed into the endocervical canal and is advanced to the uterine fundus. Using a piston like technique, with vacuum created by withdrawing the stylus, the endometrial specimen is obtained and transferred to the biopsy container. Minimal bleeding is encountered. The procedure is well tolerated.   Uterine Position: mid   Uterine Length: 8 cm   Uterine Specimen: Scant   Post procedure instructions are given. The patient is scheduled for follow up appointment.    Rubie Maid, MD [facility]

## 2019-05-02 NOTE — Patient Instructions (Signed)
Begin Premarin tablets 0.625 mg daily.  You should begin to notice a difference in your symptoms within 3-4 days.  If you desire a prescription, please send our office a message in [place].  You will need to use the tablets 21 days on, then 7 days off each month.   Decrease your premarin cream to twice weekly. Once you have been on the Premarin tablets for a month, you can then stop use of the cream.    If you do not feel any improvement in symptoms, we can change you from Premarin to the Paxil (non-hormonal option). Please let the office know through [place].  Your biopsy results will return in 3-5 business days.        Conjugated Estrogens tablets What is this medicine? CONJUGATED ESTROGENS (CON ju gate ed ESS troe jenz) is an estrogen. It is used as hormone replacement in menopausal women. It helps to treat hot flashes and prevent osteoporosis. It is also used to treat women with low hormone levels or in those who have had their ovaries removed. This medicine may be used for other purposes; ask your health care provider or pharmacist if you have questions. COMMON BRAND NAME(S): Premarin What should I tell my health care provider before I take this medicine? They need to know if you have any of these conditions: -abnormal vaginal bleeding -blood vessel disease or blood clots -breast, cervical, endometrial, ovarian, liver, or uterine cancer -dementia -diabetes -endometriosis -fibroids -gallbladder disease -heart disease or recent heart attack -high blood pressure -high cholesterol -high level of calcium in the blood -kidney disease -liver disease -mental depression -migraine headaches -protein C deficiency -protein S deficiency -stroke -tobacco smoker -an unusual or allergic reaction to estrogens, other medicines, foods, dyes, or preservatives -pregnant or trying to get pregnant -breast-feeding How should I use this medicine? Take this medicine by mouth with a glass of  water. Follow the directions on the prescription label. Take your medicine at regular intervals, at the same time each day. Do not take your medicine more often than directed. A patient package insert for the product will be given with each prescription and refill. Read this sheet carefully each time. Talk to your pediatrician regarding the use of this medicine in children. This medicine is not approved for use in children. Overdosage: If you think you have taken too much of this medicine contact a poison control center or emergency room at once. NOTE: This medicine is only for you. Do not share this medicine with others. What if I miss a dose? If you miss a dose, take it as soon as you can. If it is almost time for your next dose, take only that dose. Do not take double or extra doses. What may interact with this medicine? Do not take this medicine with any of the following medications: -aromatase inhibitors like aminoglutethimide, anastrozole, exemestane, letrozole, testolactone -metyrapone This medicine may also interact with the following medications: -barbiturates, such as phenobarbital -carbamazepine -clarithromycin -erythromycin -grapefruit juice -medicines for fungal infections like ketoconazole and itraconazole -phenytoin - rifampin -ritonavir -St. John's Wort -thyroid hormones This list may not describe all possible interactions. Give your health care provider a list of all the medicines, herbs, non-prescription drugs, or dietary supplements you use. Also tell them if you smoke, drink alcohol, or use illegal drugs. Some items may interact with your medicine. What should I watch for while using this medicine? Visit your health care professional for regular checks on your progress. You will need  a regular breast and pelvic exam and Pap smear while on this medicine. You should also discuss the need for regular mammograms with your health care professional, and follow his or her  guidelines for these tests. This medicine can make your body retain fluid, making your fingers, hands, or ankles swell. Your blood pressure can go up. Contact your doctor or health care professional if you feel you are retaining fluid. If you have any reason to think you are pregnant; stop taking this medicine at once and contact your doctor or health care professional. Smoking increases the risk of getting a blood clot or having a stroke while you are taking this medicine, especially if you are more than 43 years old. You are strongly advised not to smoke. If you wear contact lenses and notice visual changes, or if the lenses begin to feel uncomfortable, consult your eye care specialist. The tablet shell for some brands of this medicine does not dissolve. The tablet shell may appear whole in the stool. This is not a cause for concern. If you see something that resembles a tablet in your stool, talk to your healthcare provider. This medicine can increase the risk of developing a condition (endometrial hyperplasia) that may lead to cancer of the lining of the uterus. Taking progestins, another hormone drug, with this medicine lowers the risk of developing this condition. Therefore, if your uterus has not been removed (by a hysterectomy), your doctor may prescribe a progestin for you to take together with your estrogen. You should know, however, that taking estrogens with progestins may have additional health risks. You should discuss the use of estrogens and progestins with your health care professional to determine the benefits and risks for you. If you are going to have surgery, you may need to stop taking this medicine. Consult your health care professional for advice before you schedule the surgery. What side effects may I notice from receiving this medicine? Side effects that you should report to your doctor or health care professional as soon as possible: -allergic reactions like skin rash, itching or  hives, swelling of the face, lips, or tongue -breast tissue changes or discharge -changes in vision -chest pain -confusion, trouble speaking or understanding -dark urine -general ill feeling or flu-like symptoms -light-colored stools -nausea, vomiting -pain, swelling, warmth in the leg -right upper belly pain -severe headaches -shortness of breath -sudden numbness or weakness of the face, arm or leg -trouble walking, dizziness, loss of balance or coordination -unusual vaginal bleeding -yellowing of the eyes or skin Side effects that usually do not require medical attention (report to your doctor or health care professional if they continue or are bothersome): -hair loss -increased hunger or thirst -increased urination -symptoms of vaginal infection like itching, irritation or unusual discharge -unusually weak or tired This list may not describe all possible side effects. Call your doctor for medical advice about side effects. You may report side effects to FDA at 1-800-FDA-1088. Where should I keep my medicine? Keep out of the reach of children. Store at room temperature between 15 and 30 degrees C (59 and 86 degrees F). Throw away any unused medicine after the expiration date. NOTE: This sheet is a summary. It may not cover all possible information. If you have questions about this medicine, talk to your doctor, pharmacist, or health care provider.  2019 Elsevier/Gold Standard (2011-01-27 09:20:56)

## 2019-05-02 NOTE — Progress Notes (Signed)
Pt is present today for follow up from an ultrasound and endo bx. Pt stated that she is doing well no problems.

## 2019-06-24 DIAGNOSIS — N390 Urinary tract infection, site not specified: Secondary | ICD-10-CM | POA: Diagnosis not present

## 2019-06-24 DIAGNOSIS — R300 Dysuria: Secondary | ICD-10-CM | POA: Diagnosis not present

## 2019-06-27 DIAGNOSIS — M545 Low back pain: Secondary | ICD-10-CM | POA: Diagnosis not present

## 2019-06-27 DIAGNOSIS — F419 Anxiety disorder, unspecified: Secondary | ICD-10-CM | POA: Diagnosis not present

## 2019-06-27 DIAGNOSIS — E7800 Pure hypercholesterolemia, unspecified: Secondary | ICD-10-CM | POA: Diagnosis not present

## 2019-06-27 DIAGNOSIS — Z1331 Encounter for screening for depression: Secondary | ICD-10-CM | POA: Diagnosis not present

## 2019-06-27 DIAGNOSIS — R5383 Other fatigue: Secondary | ICD-10-CM | POA: Diagnosis not present

## 2019-06-27 DIAGNOSIS — E785 Hyperlipidemia, unspecified: Secondary | ICD-10-CM | POA: Diagnosis not present

## 2019-08-22 DIAGNOSIS — E785 Hyperlipidemia, unspecified: Secondary | ICD-10-CM | POA: Diagnosis not present

## 2019-08-22 DIAGNOSIS — N390 Urinary tract infection, site not specified: Secondary | ICD-10-CM | POA: Diagnosis not present

## 2019-08-22 DIAGNOSIS — M545 Low back pain: Secondary | ICD-10-CM | POA: Diagnosis not present

## 2019-08-22 DIAGNOSIS — F419 Anxiety disorder, unspecified: Secondary | ICD-10-CM | POA: Diagnosis not present

## 2019-08-22 DIAGNOSIS — R5383 Other fatigue: Secondary | ICD-10-CM | POA: Diagnosis not present

## 2019-09-03 DIAGNOSIS — E785 Hyperlipidemia, unspecified: Secondary | ICD-10-CM | POA: Diagnosis not present

## 2019-09-03 DIAGNOSIS — M545 Low back pain: Secondary | ICD-10-CM | POA: Diagnosis not present

## 2019-09-03 DIAGNOSIS — J449 Chronic obstructive pulmonary disease, unspecified: Secondary | ICD-10-CM | POA: Diagnosis not present

## 2019-09-03 DIAGNOSIS — F419 Anxiety disorder, unspecified: Secondary | ICD-10-CM | POA: Diagnosis not present

## 2019-09-03 DIAGNOSIS — N390 Urinary tract infection, site not specified: Secondary | ICD-10-CM | POA: Diagnosis not present

## 2019-09-26 DIAGNOSIS — E039 Hypothyroidism, unspecified: Secondary | ICD-10-CM | POA: Diagnosis not present

## 2019-09-26 DIAGNOSIS — F419 Anxiety disorder, unspecified: Secondary | ICD-10-CM | POA: Diagnosis not present

## 2019-09-26 DIAGNOSIS — M545 Low back pain: Secondary | ICD-10-CM | POA: Diagnosis not present

## 2019-09-26 DIAGNOSIS — R5383 Other fatigue: Secondary | ICD-10-CM | POA: Diagnosis not present

## 2019-09-26 DIAGNOSIS — E785 Hyperlipidemia, unspecified: Secondary | ICD-10-CM | POA: Diagnosis not present

## 2019-09-26 DIAGNOSIS — E7800 Pure hypercholesterolemia, unspecified: Secondary | ICD-10-CM | POA: Diagnosis not present

## 2019-09-26 DIAGNOSIS — Z1331 Encounter for screening for depression: Secondary | ICD-10-CM | POA: Diagnosis not present

## 2019-10-21 DIAGNOSIS — R350 Frequency of micturition: Secondary | ICD-10-CM | POA: Diagnosis not present

## 2019-10-21 DIAGNOSIS — M545 Low back pain: Secondary | ICD-10-CM | POA: Diagnosis not present

## 2019-10-26 DIAGNOSIS — N390 Urinary tract infection, site not specified: Secondary | ICD-10-CM | POA: Diagnosis not present

## 2019-10-26 DIAGNOSIS — F419 Anxiety disorder, unspecified: Secondary | ICD-10-CM | POA: Diagnosis not present

## 2019-10-26 DIAGNOSIS — R5383 Other fatigue: Secondary | ICD-10-CM | POA: Diagnosis not present

## 2019-10-26 DIAGNOSIS — E785 Hyperlipidemia, unspecified: Secondary | ICD-10-CM | POA: Diagnosis not present

## 2019-10-26 DIAGNOSIS — M545 Low back pain: Secondary | ICD-10-CM | POA: Diagnosis not present

## 2019-10-31 DIAGNOSIS — M545 Low back pain: Secondary | ICD-10-CM | POA: Diagnosis not present

## 2019-10-31 DIAGNOSIS — E785 Hyperlipidemia, unspecified: Secondary | ICD-10-CM | POA: Diagnosis not present

## 2019-10-31 DIAGNOSIS — J309 Allergic rhinitis, unspecified: Secondary | ICD-10-CM | POA: Diagnosis not present

## 2019-10-31 DIAGNOSIS — N390 Urinary tract infection, site not specified: Secondary | ICD-10-CM | POA: Diagnosis not present

## 2019-10-31 DIAGNOSIS — R5383 Other fatigue: Secondary | ICD-10-CM | POA: Diagnosis not present

## 2019-11-09 DIAGNOSIS — R300 Dysuria: Secondary | ICD-10-CM | POA: Diagnosis not present

## 2019-11-09 DIAGNOSIS — N390 Urinary tract infection, site not specified: Secondary | ICD-10-CM | POA: Diagnosis not present

## 2019-11-16 DIAGNOSIS — F419 Anxiety disorder, unspecified: Secondary | ICD-10-CM | POA: Diagnosis not present

## 2019-11-16 DIAGNOSIS — E785 Hyperlipidemia, unspecified: Secondary | ICD-10-CM | POA: Diagnosis not present

## 2019-11-16 DIAGNOSIS — M545 Low back pain: Secondary | ICD-10-CM | POA: Diagnosis not present

## 2019-11-16 DIAGNOSIS — J309 Allergic rhinitis, unspecified: Secondary | ICD-10-CM | POA: Diagnosis not present

## 2019-11-23 ENCOUNTER — Encounter: Payer: Self-pay | Admitting: Obstetrics and Gynecology

## 2019-11-23 ENCOUNTER — Other Ambulatory Visit: Payer: Self-pay

## 2019-11-23 ENCOUNTER — Ambulatory Visit (INDEPENDENT_AMBULATORY_CARE_PROVIDER_SITE_OTHER): Payer: Medicaid Other | Admitting: Obstetrics and Gynecology

## 2019-11-23 VITALS — BP 118/81 | HR 92 | Ht 64.5 in | Wt 186.0 lb

## 2019-11-23 DIAGNOSIS — Z87440 Personal history of urinary (tract) infections: Secondary | ICD-10-CM | POA: Diagnosis not present

## 2019-11-23 DIAGNOSIS — N951 Menopausal and female climacteric states: Secondary | ICD-10-CM

## 2019-11-23 DIAGNOSIS — R350 Frequency of micturition: Secondary | ICD-10-CM | POA: Diagnosis not present

## 2019-11-23 LAB — POCT URINALYSIS DIPSTICK
Bilirubin, UA: NEGATIVE
Blood, UA: NEGATIVE
Glucose, UA: NEGATIVE
Ketones, UA: NEGATIVE
Leukocytes, UA: NEGATIVE
Nitrite, UA: POSITIVE
Protein, UA: NEGATIVE
Spec Grav, UA: 1.015 (ref 1.010–1.025)
Urobilinogen, UA: 0.2 E.U./dL
pH, UA: 6.5 (ref 5.0–8.0)

## 2019-11-23 MED ORDER — MIRABEGRON ER 25 MG PO TB24: 25.0000 mg | ORAL_TABLET | Freq: Every day | ORAL | 0 refills | Status: DC

## 2019-11-23 NOTE — Progress Notes (Signed)
Pt present for having bladder issues. Pt stated having frequent urination, no pain with urination, back pain normal for pt due to having back issues. UA and urine culture completed.

## 2019-11-23 NOTE — Progress Notes (Signed)
    GYNECOLOGY PROGRESS NOTE  Subjective:    Patient ID: Natalie [name], female    DOB: 04-02-1976, 44 y.o.   MRN: [id number]  HPI  Patient is a 44 y.o. [id number] female who presents for complaints of urinary urgency. She notes that she has had urgency over the past 2-3 months. Was recently treated for a UTI last week with Macrobid and Azo (completed course yesterday), but still noting irritative voiding symptoms. 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  She also complains of even having bladder spasms that sometimes wake her up at night. Urinary frequency is noted mostly during the day.  Cannot recall the exact number of times she voids during the day but does report that after voiding, usually 20 minutes later she feels the urge to void again. She denies incontinence, hematuria, or urgency.  Notes she tries to take in an adequate amount of water (but uses [employer]). Denies over-consumption of tea or alcohol.    Patient notes that she was told by an urgent care provider that maybe her being "menopausal" may be contributing to her symptoms of recurrent UTIs.  She does report a cycle approximately 6 months ago.  Notes that she will have one almost every 5-6 months. Has stopped taking her Prempro.  The following portions of the patient's history were reviewed and updated as appropriate: allergies, current medications, past family history, past medical history, past social history, past surgical history and problem list.  Review of Systems Pertinent items noted in HPI and remainder of comprehensive ROS otherwise negative.   Objective:   Blood pressure 118/81, pulse 92, height 5' 4.5" (1.638 m), weight 186 lb (84.4 kg). General appearance: alert and no distress Abdomen: soft, non-tender; bowel sounds normal; no masses,  no organomegaly Remainder of exam deferred.    Assessment:    1. Frequent urination   2. History of UTI   3. Perimenopausal    Plan:    1. Frequent urination, UA done today but may not be conclusive due to patient's recent history of UTI symptom treatment with Azo.  Will send culture.  Discussed possibility of other causes of urinary frequency including vaginal atrophy, overactive bladder, and IC (although questionnaire for IC negative today).  Will prescribe trial of Myrbetric, also given list of bladder irritants to avoid, and will give samples of Premarin cream to use daily at introitus for 2-3 weeks.   RTC in 2-3 weeks for f/u symptoms.    Rubie Maid, MD [facility]

## 2019-11-23 NOTE — Patient Instructions (Signed)
Urinary Frequency, Adult Urinary frequency means urinating more often than usual. You may urinate every 1-2 hours even though you drink a normal amount of fluid and do not have a bladder infection or condition. Although you urinate more often than normal, the total amount of urine produced in a day is normal. With urinary frequency, you may have an urgent need to urinate often. The stress and anxiety of needing to find a bathroom quickly can make this urge worse. This condition may go away on its own or you may need treatment at home. Home treatment may include bladder training, exercises, taking medicines, or making changes to your diet. Follow these instructions at home: Bladder health   Keep a bladder diary if told by your health care provider. Keep track of: ? What you eat and drink. ? How often you urinate. ? How much you urinate.  Follow a bladder training program if told by your health care provider. This may include: ? Learning to delay going to the bathroom. ? Double urinating (voiding). This helps if you are not completely emptying your bladder. ? Scheduled voiding.  Do Kegel exercises as told by your health care provider. Kegel exercises strengthen the muscles that help control urination, which may help the condition. Eating and drinking  If told by your health care provider, make diet changes, such as: ? Avoiding caffeine. ? Drinking fewer fluids, especially alcohol. ? Not drinking in the evening. ? Avoiding foods or drinks that may irritate the bladder. These include coffee, tea, soda, artificial sweeteners, citrus, tomato-based foods, and chocolate. ? Eating foods that help prevent or ease constipation. Constipation can make this condition worse. Your health care provider may recommend that you:  Drink enough fluid to keep your urine pale yellow.  Take over-the-counter or prescription medicines.  Eat foods that are high in fiber, such as beans, whole grains, and fresh  fruits and vegetables.  Limit foods that are high in fat and processed sugars, such as fried or sweet foods. General instructions  Take over-the-counter and prescription medicines only as told by your health care provider.  Keep all follow-up visits as told by your health care provider. This is important. Contact a health care provider if:  You start urinating more often.  You feel pain or irritation when you urinate.  You notice blood in your urine.  Your urine looks cloudy.  You develop a fever.  You begin vomiting. Get help right away if:  You are unable to urinate. Summary  Urinary frequency means urinating more often than usual. With urinary frequency, you may urinate every 1-2 hours even though you drink a normal amount of fluid and do not have a bladder infection or other bladder condition.  Your health care provider may recommend that you keep a bladder diary, follow a bladder training program, or make dietary changes.  If told by your health care provider, do Kegel exercises to strengthen the muscles that help control urination.  Take over-the-counter and prescription medicines only as told by your health care provider.  Contact a health care provider if your symptoms do not improve or get worse. This information is not intended to replace advice given to you by your health care provider. Make sure you discuss any questions you have with your health care provider. Document Revised: 05/04/2018 Document Reviewed: 05/04/2018 Elsevier Patient Education  2020 Elsevier Inc.  

## 2019-11-25 LAB — URINE CULTURE

## 2019-11-26 ENCOUNTER — Telehealth: Payer: Self-pay | Admitting: Obstetrics and Gynecology

## 2019-11-26 NOTE — Telephone Encounter (Signed)
Pt called in abt her meds that was sent over I informed the pt that we got the fax and that they will check there box

## 2019-11-27 NOTE — Telephone Encounter (Signed)
pharmacy called to check on the PA and inform them that I had not received a PA for the pt. The pharmacy stated that they had faxed several forms but I had not received any of them.

## 2019-11-27 NOTE — Telephone Encounter (Signed)
Pt called stating the pharmacy needed a PA for Myrbetriq 25 mg pt was informed that I would have to contact the pharmacy for me information. Pt stated that she understood.

## 2019-11-27 NOTE — Telephone Encounter (Signed)
PT calling about her medication. Says pharmacy is waiting on Dr. Marcelline Mates

## 2019-11-28 ENCOUNTER — Other Ambulatory Visit: Payer: Self-pay | Admitting: Obstetrics and Gynecology

## 2019-11-28 MED ORDER — OXYBUTYNIN CHLORIDE ER 5 MG PO TB24: 5.0000 mg | ORAL_TABLET | Freq: Every day | ORAL | 2 refills | Status: DC

## 2019-11-28 NOTE — Progress Notes (Signed)
Changed patient from Myrbetriq to Ditropan due to insurance not covering Myrbetric.

## 2019-11-29 DIAGNOSIS — M5136 Other intervertebral disc degeneration, lumbar region: Secondary | ICD-10-CM | POA: Diagnosis not present

## 2019-11-29 DIAGNOSIS — M545 Low back pain: Secondary | ICD-10-CM | POA: Diagnosis not present

## 2019-11-29 DIAGNOSIS — E785 Hyperlipidemia, unspecified: Secondary | ICD-10-CM | POA: Diagnosis not present

## 2019-11-29 DIAGNOSIS — G4700 Insomnia, unspecified: Secondary | ICD-10-CM | POA: Diagnosis not present

## 2019-12-03 NOTE — Telephone Encounter (Signed)
Well she's already been taking it twice a day, so I guess she is able to tolerate it.  We can change her prescription to reflect the new dose.

## 2019-12-06 NOTE — Telephone Encounter (Signed)
Pt has an appointment to see provider 12/07/2019.

## 2019-12-06 NOTE — Progress Notes (Signed)
Pt present today for 3 week f/u for urinary symptoms. Pt stated that she would like an increased in Ditropan-XL.  Pt original scipt is 65m once daily. Pt is currently taking the 5 mg dose twice daily at 8am and at bedtime.

## 2019-12-07 ENCOUNTER — Other Ambulatory Visit: Payer: Self-pay

## 2019-12-07 ENCOUNTER — Ambulatory Visit (INDEPENDENT_AMBULATORY_CARE_PROVIDER_SITE_OTHER): Payer: Medicaid Other | Admitting: Obstetrics and Gynecology

## 2019-12-07 ENCOUNTER — Encounter: Payer: Self-pay | Admitting: Obstetrics and Gynecology

## 2019-12-07 VITALS — BP 108/70 | HR 96 | Ht 64.5 in | Wt 180.0 lb

## 2019-12-07 DIAGNOSIS — N952 Postmenopausal atrophic vaginitis: Secondary | ICD-10-CM | POA: Diagnosis not present

## 2019-12-07 DIAGNOSIS — N3281 Overactive bladder: Secondary | ICD-10-CM

## 2019-12-07 DIAGNOSIS — E66811 Obesity, class 1: Secondary | ICD-10-CM

## 2019-12-07 DIAGNOSIS — E669 Obesity, unspecified: Secondary | ICD-10-CM | POA: Diagnosis not present

## 2019-12-07 MED ORDER — SOLIFENACIN SUCCINATE 5 MG PO TABS: 5.0000 mg | ORAL_TABLET | Freq: Every day | ORAL | 3 refills | Status: DC

## 2019-12-07 NOTE — Progress Notes (Signed)
    GYNECOLOGY PROGRESS NOTE  Subjective:    Patient ID: Natalie [name], female    DOB: 16-Oct-1976, 44 y.o.   MRN: [id number]  HPI  Patient is a 44 y.o. [id number] female who presents for 1 month follow up of overactive bladder symptoms.  Patient notes that overall her symptoms have improved some, her spasms are gone, but still having nocturia (although only usually 1 episode at night), but is voiding more during the day.  Drinks fluids up until 9 pm. Takes medicine at 9 pm.Goes to bed at 11. Has maxed out dose at 10 mg BID.  Is changing diet to avoid bladder irritants. Using Premarin cream as prescribed for vaginal atrophy.   Side effects include dry mouth, however is not overly bothersome to patient.   Of note, patient desires to discuss options for weight loss.  Notes that in the past she has tried several different OTC supplements without any significant change in weight.  States that her son was recently placed on Metformin for his obesity. Wonders if she should try this. Her comorbidities include history of Grave's disease, hyperlipidemia, and hypertension.   The following portions of the patient's history were reviewed and updated as appropriate: allergies, current medications, past family history, past medical history, past social history, past surgical history and problem list.   Review of Systems Pertinent items noted in HPI and remainder of comprehensive ROS otherwise negative.   Objective:   Blood pressure 108/70, pulse 96, height 5' 4.5" (1.638 m), weight 180 lb (81.6 kg). General appearance: alert and no distress Remainder of exam deferred.    Assessment:   1. OAB (overactive bladder)   2. Vaginal atrophy   3. Obesity (BMI 30.0-34.9)    Plan:   - Will change from Ditropan to Vesicare to assess if this is a better medication.  Initially prescribed Myrbetric however insurance would not cover. Encouraged decreased fluid intake at night. Can use Biotene as needed for dry  mouth. Continue to encourage decreasing alcohol intake as this is a known bladder irritant.  - Continue use of Premarin cream for atrophy as prescribed.  - Patient with obesity and comorbidities, desiring weight loss management. Is due for labs by her PCP next week. Will review labs, and decide on necessary medications for weight loss.  - To f/u for visit after labs received.    Rubie Maid, MD [facility]

## 2019-12-07 NOTE — Patient Instructions (Signed)

## 2019-12-11 ENCOUNTER — Telehealth: Payer: Self-pay

## 2019-12-11 DIAGNOSIS — E785 Hyperlipidemia, unspecified: Secondary | ICD-10-CM | POA: Diagnosis not present

## 2019-12-11 DIAGNOSIS — R5383 Other fatigue: Secondary | ICD-10-CM | POA: Diagnosis not present

## 2019-12-11 DIAGNOSIS — M545 Low back pain: Secondary | ICD-10-CM | POA: Diagnosis not present

## 2019-12-11 DIAGNOSIS — J209 Acute bronchitis, unspecified: Secondary | ICD-10-CM | POA: Diagnosis not present

## 2019-12-11 NOTE — Telephone Encounter (Signed)
Patient is unable to obtain the medication prescribed. Pharmacist says they need authorization.

## 2019-12-12 ENCOUNTER — Telehealth: Payer: Self-pay | Admitting: Obstetrics and Gynecology

## 2019-12-12 NOTE — Telephone Encounter (Signed)
Spoke to pt via mychart. Please see mychart messages.

## 2019-12-12 NOTE — Telephone Encounter (Signed)
Please see my chart messages

## 2019-12-12 NOTE — Telephone Encounter (Signed)
Pt called in and stated she is having pressure in her bloater and the feeling is like she has to go but she doesn't. The pt is requesting a call back from the nurse. Please advise

## 2019-12-12 NOTE — Telephone Encounter (Signed)
The pt called in and she is having trouble at the pharmacy filling her med. The medication is vericare. The [facility] [address]. Please advise

## 2019-12-13 DIAGNOSIS — M545 Low back pain: Secondary | ICD-10-CM | POA: Diagnosis not present

## 2019-12-13 DIAGNOSIS — E785 Hyperlipidemia, unspecified: Secondary | ICD-10-CM | POA: Diagnosis not present

## 2019-12-13 DIAGNOSIS — R5383 Other fatigue: Secondary | ICD-10-CM | POA: Diagnosis not present

## 2019-12-13 DIAGNOSIS — J309 Allergic rhinitis, unspecified: Secondary | ICD-10-CM | POA: Diagnosis not present

## 2019-12-13 DIAGNOSIS — N390 Urinary tract infection, site not specified: Secondary | ICD-10-CM | POA: Diagnosis not present

## 2019-12-14 ENCOUNTER — Other Ambulatory Visit: Payer: Self-pay

## 2019-12-14 MED ORDER — ESTROGENS, CONJUGATED 0.625 MG/GM VA CREA: 1.0000 | TOPICAL_CREAM | Freq: Every day | VAGINAL | 12 refills | Status: DC

## 2019-12-14 MED ORDER — PREMPRO 0.625-5 MG PO TABS: 1.0000 | ORAL_TABLET | Freq: Every day | ORAL | 6 refills | Status: DC

## 2019-12-17 ENCOUNTER — Telehealth: Payer: Self-pay | Admitting: Obstetrics and Gynecology

## 2019-12-17 ENCOUNTER — Telehealth: Payer: Self-pay

## 2019-12-17 NOTE — Telephone Encounter (Signed)
The pt called and and stated that she had some vericare sent over to the pharmacy. The pt is having a hard time urination. The pt stated that she would like to have something else sent in for her. It pt is still waiting on a call back. The pt is still having some pressure and burning when she urinates. Pt is requesting a call back. Please advise

## 2019-12-17 NOTE — Telephone Encounter (Signed)
I'm not sure how that's possible that the Vesicare could be causing a UTI as she has only been on the medication for a short period of time and that is not a known side effect of that medication.  Did they collect a urine sample and send for a culture?  What exactly were her symptoms?  Dr. Marcelline Mates

## 2019-12-17 NOTE — Telephone Encounter (Signed)
Pt called stating that she was having UTI symptoms and went to her PCP for treatment. Pt stated that she was informed that it was the Vesicare that was causing the UTI. Pt is requesting what medication can she take. Pt is currently went back to taking Oxybutynin 79m twice a day. Please advise. Thanks [employer]

## 2019-12-17 NOTE — Telephone Encounter (Signed)
Please see another phone encounter.

## 2019-12-17 NOTE — Telephone Encounter (Signed)
Pt called in and stated that she hasn't go a call or a message back from when she called in on the 2/3. The pt is requesting a call from the nurse .please advise

## 2019-12-18 NOTE — Telephone Encounter (Signed)
Well, I believe this was a coincidence with the medication, however if she insists on trying another medication we can try to prescribe Toviaz 4 mg daily.  This should be covered by her insurance. Please ensure she has an appointment with me (can be a televisit if desired) in the next 4-6 weeks to assess her response to the new medication.

## 2019-12-18 NOTE — Telephone Encounter (Signed)
Her PCP did do a urine sample and gave pt antibotics for the UTI. Pt's PCP was the one that informed pt that the Vesicare would cause UTI.

## 2019-12-19 NOTE — Telephone Encounter (Signed)
Pt has agreed to try Toviaz. Please send prescription to pharmacy, [facility] [address]. Pt will scheldule to see Dr. Marcelline Mates in 4-6 weeks

## 2019-12-19 NOTE — Telephone Encounter (Signed)
Patient called saying the pharmacy has not received her prescription.

## 2019-12-20 DIAGNOSIS — R5383 Other fatigue: Secondary | ICD-10-CM | POA: Diagnosis not present

## 2019-12-20 DIAGNOSIS — E785 Hyperlipidemia, unspecified: Secondary | ICD-10-CM | POA: Diagnosis not present

## 2019-12-20 DIAGNOSIS — J309 Allergic rhinitis, unspecified: Secondary | ICD-10-CM | POA: Diagnosis not present

## 2019-12-20 DIAGNOSIS — G4700 Insomnia, unspecified: Secondary | ICD-10-CM | POA: Diagnosis not present

## 2019-12-21 ENCOUNTER — Other Ambulatory Visit: Payer: Self-pay

## 2019-12-21 MED ORDER — FESOTERODINE FUMARATE ER 4 MG PO TB24: 4.0000 mg | ORAL_TABLET | Freq: Every day | ORAL | 6 refills | Status: DC

## 2019-12-21 NOTE — Telephone Encounter (Signed)
Please see my chart messages

## 2019-12-24 ENCOUNTER — Telehealth: Payer: Self-pay | Admitting: Obstetrics and Gynecology

## 2019-12-24 NOTE — Telephone Encounter (Signed)
Pt called and appt made to follow up with [facility] after starting new medication Toviaz.

## 2019-12-24 NOTE — Telephone Encounter (Signed)
Patient returned your phone call this morning while we had technical difficulties. Advised you would return phone call when system cam back up.Patient called from [phone].

## 2019-12-25 DIAGNOSIS — M545 Low back pain: Secondary | ICD-10-CM | POA: Diagnosis not present

## 2019-12-25 DIAGNOSIS — R5383 Other fatigue: Secondary | ICD-10-CM | POA: Diagnosis not present

## 2019-12-25 DIAGNOSIS — E7800 Pure hypercholesterolemia, unspecified: Secondary | ICD-10-CM | POA: Diagnosis not present

## 2019-12-25 DIAGNOSIS — E785 Hyperlipidemia, unspecified: Secondary | ICD-10-CM | POA: Diagnosis not present

## 2019-12-25 DIAGNOSIS — J309 Allergic rhinitis, unspecified: Secondary | ICD-10-CM | POA: Diagnosis not present

## 2019-12-28 DIAGNOSIS — N390 Urinary tract infection, site not specified: Secondary | ICD-10-CM | POA: Diagnosis not present

## 2019-12-31 ENCOUNTER — Other Ambulatory Visit: Payer: Self-pay

## 2019-12-31 DIAGNOSIS — N3281 Overactive bladder: Secondary | ICD-10-CM

## 2019-12-31 DIAGNOSIS — Z87440 Personal history of urinary (tract) infections: Secondary | ICD-10-CM

## 2019-12-31 DIAGNOSIS — R350 Frequency of micturition: Secondary | ICD-10-CM

## 2019-12-31 NOTE — Telephone Encounter (Signed)
We can refer her.

## 2020-01-04 ENCOUNTER — Telehealth: Payer: Self-pay | Admitting: Obstetrics and Gynecology

## 2020-01-04 NOTE — Telephone Encounter (Signed)
Patient called wanting to follow up with Dr. Marcelline Mates regarding a My Chart message she had sent a few days ago.

## 2020-01-10 NOTE — Telephone Encounter (Signed)
Please have patient schedule a televisit with me to discuss other options. And she needs a nutrition consult.

## 2020-01-11 DIAGNOSIS — M545 Low back pain: Secondary | ICD-10-CM | POA: Diagnosis not present

## 2020-01-11 DIAGNOSIS — E785 Hyperlipidemia, unspecified: Secondary | ICD-10-CM | POA: Diagnosis not present

## 2020-01-11 DIAGNOSIS — J449 Chronic obstructive pulmonary disease, unspecified: Secondary | ICD-10-CM | POA: Diagnosis not present

## 2020-01-11 DIAGNOSIS — F419 Anxiety disorder, unspecified: Secondary | ICD-10-CM | POA: Diagnosis not present

## 2020-01-11 DIAGNOSIS — N390 Urinary tract infection, site not specified: Secondary | ICD-10-CM | POA: Diagnosis not present

## 2020-01-24 DIAGNOSIS — R5383 Other fatigue: Secondary | ICD-10-CM | POA: Diagnosis not present

## 2020-01-24 DIAGNOSIS — M545 Low back pain: Secondary | ICD-10-CM | POA: Diagnosis not present

## 2020-01-24 DIAGNOSIS — N390 Urinary tract infection, site not specified: Secondary | ICD-10-CM | POA: Diagnosis not present

## 2020-01-24 DIAGNOSIS — E785 Hyperlipidemia, unspecified: Secondary | ICD-10-CM | POA: Diagnosis not present

## 2020-01-28 ENCOUNTER — Encounter: Payer: Self-pay | Admitting: Urology

## 2020-01-28 ENCOUNTER — Telehealth: Payer: Self-pay | Admitting: Radiology

## 2020-01-28 ENCOUNTER — Other Ambulatory Visit: Payer: Self-pay

## 2020-01-28 ENCOUNTER — Ambulatory Visit (INDEPENDENT_AMBULATORY_CARE_PROVIDER_SITE_OTHER): Payer: Medicaid Other | Admitting: Urology

## 2020-01-28 VITALS — BP 119/79 | HR 98 | Ht 64.5 in | Wt 185.0 lb

## 2020-01-28 DIAGNOSIS — N3281 Overactive bladder: Secondary | ICD-10-CM | POA: Diagnosis not present

## 2020-01-28 DIAGNOSIS — N3020 Other chronic cystitis without hematuria: Secondary | ICD-10-CM

## 2020-01-28 DIAGNOSIS — N390 Urinary tract infection, site not specified: Secondary | ICD-10-CM

## 2020-01-28 LAB — URINALYSIS, COMPLETE
Bilirubin, UA: NEGATIVE
Glucose, UA: NEGATIVE
Ketones, UA: NEGATIVE
Leukocytes,UA: NEGATIVE
Nitrite, UA: NEGATIVE
Protein,UA: NEGATIVE
RBC, UA: NEGATIVE
Specific Gravity, UA: 1.02 (ref 1.005–1.030)
Urobilinogen, Ur: 0.2 mg/dL (ref 0.2–1.0)
pH, UA: 6.5 (ref 5.0–7.5)

## 2020-01-28 LAB — MICROSCOPIC EXAMINATION: RBC, Urine: NONE SEEN /hpf (ref 0–2)

## 2020-01-28 LAB — BLADDER SCAN AMB NON-IMAGING: Scan Result: 10

## 2020-01-28 MED ORDER — TRIMETHOPRIM 100 MG PO TABS: 100.0000 mg | ORAL_TABLET | Freq: Every day | ORAL | 11 refills | Status: DC

## 2020-01-28 NOTE — Telephone Encounter (Signed)
Patient Natalie [name] saying a prescription was called in for her after an appointment this morning but it is not covered by her insurance. She would like a return call to [phone].

## 2020-01-28 NOTE — Telephone Encounter (Signed)
Spoke with patient, Medicaid will not cover Trimpex. Is there an alternate you suggest? Please advise

## 2020-01-28 NOTE — Progress Notes (Signed)
01/28/2020 10:15 AM   Natalie [name] [birth year] [id number]  Referring provider: Lorelee Market, [place],  [place] [address]  Chief Complaint  Patient presents with  . Over Active Bladder    HPI: I was consulted to assess the patient for possible overactive bladder or urinary tract infections.  She thinks he gets 8 infections a year.  She will get a strong urge and then she sit and cannot urinate.  She will get a tingling feeling in the vaginal area.  The symptoms seem to be more from a possible infection and they usually temporarily respond to an antibiotic.  I think she still [name] some of the symptoms when she is not infected.  She voids every 1-2 hours gets up once at night.  She is continent.  She is on Toviaz which is helping some.  She quit smoking 3 years ago.  It appears that she tried Vesicare and oxybutynin.  She still [name] some urgency but overall the Toviaz [name] helped   PMH: Past Medical History:  Diagnosis Date  . Dysplasia of cervix, low grade (CIN 1) 02/15/2017   Will need repeat pap smear in 1 year.   Marland Kitchen History of Graves' disease   . Hyperlipemia   . Hypertension   . Ovarian cyst   . Vitamin D deficiency     Surgical History: Past Surgical History:  Procedure Laterality Date  . DILATION AND CURETTAGE OF UTERUS    . TONSILECTOMY/ADENOIDECTOMY WITH MYRINGOTOMY    . TUBAL LIGATION      Home Medications:  Allergies as of 01/28/2020      Reactions   Hydrocodone-acetaminophen Itching   Oxycodone-acetaminophen Other (See Comments), Itching      Medication List       Accurate as of January 28, 2020 10:15 AM. If you have any questions, ask your nurse or doctor.        atorvastatin 40 MG tablet Commonly known as: LIPITOR Take by mouth.   buPROPion 300 MG 24 hr tablet Commonly known as: WELLBUTRIN XL Take by mouth.   busPIRone 10 MG tablet Commonly known as: BUSPAR Take 10 mg by mouth 2 (two) times daily.   conjugated estrogens  vaginal cream Commonly known as: PREMARIN Place 1 Applicatorful vaginally daily.   fesoterodine 4 MG Tb24 tablet Commonly known as: TOVIAZ Take 1 tablet (4 mg total) by mouth daily.   Fish Oil 1000 MG Caps Take by mouth.   hydrOXYzine 25 MG tablet Commonly known as: ATARAX/VISTARIL Take 25 mg by mouth 3 (three) times daily as needed.   levothyroxine 50 MCG tablet Commonly known as: SYNTHROID Take 50 mcg by mouth daily before breakfast.   meclizine 25 MG tablet Commonly known as: ANTIVERT Take 25 mg by mouth 3 (three) times daily as needed.   pantoprazole 40 MG tablet Commonly known as: PROTONIX Take 40 mg by mouth daily.   solifenacin 5 MG tablet Commonly known as: VESIcare Take 1 tablet (5 mg total) by mouth daily.   traZODone 50 MG tablet Commonly known as: DESYREL Take 50 mg by mouth at bedtime.   Vitamin D (Ergocalciferol) 1.25 MG (50000 UNIT) Caps capsule Commonly known as: DRISDOL Take 1 capsule (50,000 Units total) by mouth once a week.       Allergies:  Allergies  Allergen Reactions  . Hydrocodone-Acetaminophen Itching  . Oxycodone-Acetaminophen Other (See Comments) and Itching    Family History: Family History  Problem Relation Age of Onset  . Hyperlipidemia Mother   .  Hyperthyroidism Mother   . Thyroid cancer Father   . Marfan syndrome Father   . Marfan syndrome Sister   . Depression Sister   . Depression Brother   . Breast cancer Paternal Grandmother        great PGM    Social History:  reports that she quit smoking about 3 years ago. Her smoking use included cigarettes. [name] a 5.00 pack-year smoking history. [name] never used smokeless tobacco. She reports current alcohol use. She reports that she does not use drugs.  ROS:                                        Physical Exam: There were no vitals taken for this visit.  Constitutional:  Alert and oriented, No acute distress. HEENT: [place] AT, moist mucus  membranes.  Trachea midline, no masses. Cardiovascular: No clubbing, cyanosis, or edema. Respiratory: Normal respiratory effort, no increased work of breathing. GI: Abdomen is soft, nontender, nondistended, no abdominal masses GU: Supported bladder neck and no stress incontinence Skin: No rashes, bruises or suspicious lesions. Lymph: No cervical or inguinal adenopathy. Neurologic: Grossly intact, no focal deficits, moving all 4 extremities. Psychiatric: Normal mood and affect.  Laboratory Data: No results found for: WBC, HGB, HCT, MCV, PLT  No results found for: CREATININE  No results found for: PSA  No results found for: TESTOSTERONE  No results found for: HGBA1C  Urinalysis    Component Value Date/Time   COLORURINE Yellow 10/09/2013 0859   APPEARANCEUR Clear 10/09/2013 0859   LABSPEC 1.006 10/09/2013 0859   PHURINE 6.0 10/09/2013 0859   GLUCOSEU Negative 10/09/2013 0859   HGBUR Negative 10/09/2013 0859   BILIRUBINUR neg 11/23/2019 [place] Negative 10/09/2013 0859   KETONESUR Negative 10/09/2013 0859   PROTEINUR Negative 11/23/2019 1349   PROTEINUR Negative 10/09/2013 0859   UROBILINOGEN 0.2 11/23/2019 1349   NITRITE positive 11/23/2019 1349   NITRITE Negative 10/09/2013 0859   LEUKOCYTESUR Negative 11/23/2019 1349   LEUKOCYTESUR Negative 10/09/2013 0859    Pertinent Imaging:  Last urine culture negative   Assessment & Plan: Pathophysiology of chronic cystitis discussed.  Probiotics and suppression therapy discussed.  Reassess in 8 weeks on trimethoprim 100 mg with 30x11.  Stay on [place].  If she does down regulate consider stopping the Toviaz.  Call if urine culture positive.  Call if ultrasound normal  1. OAB (overactive bladder)  - Urinalysis, Complete - BLADDER SCAN AMB NON-IMAGING   No follow-ups on file.  Reece Packer, MD  [facility] [address], [place], [place] [address] [phone]

## 2020-01-29 ENCOUNTER — Telehealth: Payer: Self-pay

## 2020-01-29 NOTE — Telephone Encounter (Signed)
Macrodantin 100 mg po daily 30x11

## 2020-01-29 NOTE — Telephone Encounter (Signed)
Please clarify What is Trimpex It should be trimethoprim 100 mg po daily 30x11 Was this an error= please let me know Call me at [phone]

## 2020-01-29 NOTE — Telephone Encounter (Signed)
Received notice from pharmacy that TRIMETHOPRIM is not covered by patients insurance, no alternatives are listed. Please advise.

## 2020-01-30 ENCOUNTER — Ambulatory Visit (INDEPENDENT_AMBULATORY_CARE_PROVIDER_SITE_OTHER): Payer: Medicaid Other | Admitting: Obstetrics and Gynecology

## 2020-01-30 ENCOUNTER — Other Ambulatory Visit: Payer: Self-pay

## 2020-01-30 ENCOUNTER — Encounter: Payer: Self-pay | Admitting: Obstetrics and Gynecology

## 2020-01-30 VITALS — BP 119/70 | Ht 62.0 in | Wt 185.0 lb

## 2020-01-30 DIAGNOSIS — E785 Hyperlipidemia, unspecified: Secondary | ICD-10-CM | POA: Diagnosis not present

## 2020-01-30 DIAGNOSIS — I10 Essential (primary) hypertension: Secondary | ICD-10-CM | POA: Diagnosis not present

## 2020-01-30 DIAGNOSIS — E669 Obesity, unspecified: Secondary | ICD-10-CM | POA: Diagnosis not present

## 2020-01-30 DIAGNOSIS — Z7689 Persons encountering health services in other specified circumstances: Secondary | ICD-10-CM | POA: Diagnosis not present

## 2020-01-30 DIAGNOSIS — E0500 Thyrotoxicosis with diffuse goiter without thyrotoxic crisis or storm: Secondary | ICD-10-CM | POA: Diagnosis not present

## 2020-01-30 LAB — CULTURE, URINE COMPREHENSIVE

## 2020-01-30 MED ORDER — PHENTERMINE HCL 37.5 MG PO CAPS: 37.5000 mg | ORAL_CAPSULE | ORAL | 0 refills | Status: DC

## 2020-01-30 MED ORDER — PHENTERMINE HCL 30 MG PO CAPS: 30.0000 mg | ORAL_CAPSULE | ORAL | 0 refills | Status: DC

## 2020-01-30 MED ORDER — NITROFURANTOIN MACROCRYSTAL 100 MG PO CAPS: 100.0000 mg | ORAL_CAPSULE | Freq: Every day | ORAL | 11 refills | Status: DC

## 2020-01-30 NOTE — Telephone Encounter (Signed)
Script was sent to pharmacy.

## 2020-01-30 NOTE — Telephone Encounter (Signed)
Patient was able to pick up trimethoprim with a goodrx.

## 2020-01-30 NOTE — Progress Notes (Signed)
Virtual Visit via Telephone Note  I connected with Natalie [name] on 01/30/20 at  1:54 PM EDT by telephone and verified that I am speaking with the correct person using two identifiers.   I discussed the limitations, risks, security and privacy concerns of performing an evaluation and management service by telephone and the availability of in person appointments. I also discussed with the patient that there may be a patient responsible charge related to this service. The patient expressed understanding and agreed to proceed.   History of Present Illness:  Patient is a 44 y.o. [id number] female who presents for initial weight loss management visit. Patient desires to discuss her options for weight loss.  She reports that in the past [name] tried several different OTC weight loss supplements (including Orlistat) without any significant change in weight.     Current interventions:  1. Diet - she reports eating mostly baked/grilled meats along with veggies. Does report that she enjoys bread fairly regularly (specifically bagels).  For breakfast she will often consume Insta-breakfast (powder/shake meal replacement). Eats microwavable frozen vegetables for sides.  Does not consume sodas. [name] cut out tea.  Consumes 1 cup of decaf coffee daily.  [name] significantly decreased her alcohol intake recently due to bladder issues. Denies issues with cravings or late night snacking.  2. Activity -  Uses the treadmill ~ 30 min per day usually (although notes that [name] slacked off recently, however plans to resume). Also plans to walk at least 30 min in the evenings as the weather is getting warmer.  3. Reports bowel movements are not regular. Patient notes that [name] never really had regular bowel movements.  May go every 2-4 days.    The following portions of the patient's history were reviewed and updated as appropriate:  She  [name] a past medical history of Dysplasia of cervix, low grade (CIN 1) (02/15/2017),  History of Graves' disease, Hyperlipemia, Hypertension, Marfan syndrome, Ovarian cyst, and Vitamin D deficiency.   Her family history includes Breast cancer in her paternal grandmother; Depression in her brother and sister; Hyperlipidemia in her mother; Hyperthyroidism in her mother; Marfan syndrome in her father and sister; Thyroid cancer in her father.   Current Outpatient Medications on File Prior to Visit  Medication Sig Dispense Refill  . atorvastatin (LIPITOR) 40 MG tablet Take by mouth.    Marland Kitchen buPROPion (WELLBUTRIN XL) 300 MG 24 hr tablet Take by mouth.    . busPIRone (BUSPAR) 10 MG tablet Take 10 mg by mouth 2 (two) times daily.    Marland Kitchen conjugated estrogens (PREMARIN) vaginal cream Place 1 Applicatorful vaginally daily. 42.5 g 12  . fesoterodine (TOVIAZ) 4 MG TB24 tablet Take 1 tablet (4 mg total) by mouth daily. 30 tablet 6  . hydrOXYzine (ATARAX/VISTARIL) 25 MG tablet Take 25 mg by mouth 3 (three) times daily as needed.    Marland Kitchen levothyroxine (SYNTHROID) 50 MCG tablet Take 50 mcg by mouth daily before breakfast.    . nitrofurantoin (MACRODANTIN) 100 MG capsule Take 1 capsule (100 mg total) by mouth daily. 30 capsule 11  . Omega-3 Fatty Acids (FISH OIL) 1000 MG CAPS Take by mouth.    . pantoprazole (PROTONIX) 40 MG tablet Take 40 mg by mouth daily.  5  . traZODone (DESYREL) 50 MG tablet Take 50 mg by mouth at bedtime. 100 mg at bedtime    . trimethoprim (TRIMPEX) 100 MG tablet Take 1 tablet (100 mg total) by mouth daily. 30 tablet 11  .  Vitamin D, Ergocalciferol, (DRISDOL) 50000 UNITS CAPS capsule Take 1 capsule (50,000 Units total) by mouth once a week. 12 capsule 0   No current facility-administered medications on file prior to visit.   She is allergic to hydrocodone-acetaminophen and oxycodone-acetaminophen..   Review of Systems Pertinent items noted in HPI and remainder of comprehensive ROS otherwise negative.   Objective:    Observations/Objective: Vitals with BMI 01/30/2020  01/28/2020 12/07/2019  Height 5' 4.5" 5' 4.5" 5' 4.5"  Weight 185 lbs 185 lbs 180 lbs  BMI 31.28 24.58 09.98  Systolic 338 250 539  Diastolic 70 79 70  Pulse - 98 96   Vitals are self-reported.   General appearance: alert and no distress Abdomen: soft, non-tender.  Waist circumference (not obtained today).    Assessment and Plan: - Referral to Nutritionist.  Will also give info on high protein/low fat/low carb diet. Encouraged to follow Mediterranean or Keto diets as models to help create meal plans.  - Patient encouraged to maintain activity level of moderate physical activity 30 min or more at least 5 times per week.  - Encouraged use of stool softeners or Miralax to create a more regular bowel regimen (declined natural remedies such as prune juice or fiber).  Also encouraged increasing water intake. Should consume approximately 64 oz of water daily (however may need to adjust due to patient's bladder frequency).  - Discussed medication options for patient.  She would be a candidate for use of Phentermine as her BPs are well controlled. Discussed potential side effects. Discussed that this medication is considered a controlled substance and must have judicious use. Advised that the goal with use of weight loss medications is loss of 10% of total body weight in the first 3 months. Can re-evaluate continued use of medication after that time. Patient notes understanding.     Follow Up Instructions:   Patient [name] an appointment scheduled in 1 week for follow up of her bladder issues.  Will obtain waist measurement then and assess BP and for side effects.   I discussed the assessment and treatment plan with the patient. The patient was provided an opportunity to ask questions and all were answered. The patient agreed with the plan and demonstrated an understanding of the instructions.   The patient was advised to call back or seek an in-person evaluation if the symptoms worsen or if the  condition fails to improve as anticipated.  I provided 21 minutes of non-face-to-face time during this encounter.   Rubie Maid, MD  [facility]

## 2020-01-30 NOTE — Progress Notes (Signed)
Pt present for televisit. Pt stated that she was doing well but has noticed some changes in her cycle. Pt has a visit to discuss weight management options.

## 2020-01-30 NOTE — Patient Instructions (Signed)
Protein Content in Foods  Generally, most healthy people need around 50 grams of protein each day. Depending on your overall health, you may need more or less protein in your diet. Talk to your health care provider or dietitian about how much protein you need. See the following list for the protein content of some common foods. High-protein foods High-protein foods contain 4 grams (4 g) or more of protein per serving. They include:  Beef, ground sirloin (cooked) -- 3 oz have 24 g of protein.  Cheese (hard) -- 1 oz has 7 g of protein.  Chicken breast, boneless and skinless (cooked) -- 3 oz have 13.4 g of protein.  Cottage cheese -- 1/2 cup has 13.4 g of protein.  Egg -- 1 egg has 6 g of protein.  Fish, filet (cooked) -- 1 oz has 6-7 g of protein.  Garbanzo beans (canned or cooked) -- 1/2 cup has 6-7 g of protein.  Kidney beans (canned or cooked) -- 1/2 cup has 6-7 g of protein.  Lamb (cooked) -- 3 oz has 24 g of protein.  Milk -- 1 cup (8 oz) has 8 g of protein.  Nuts (peanuts, pistachios, almonds) -- 1 oz has 6 g of protein.  Peanut butter -- 1 oz has 7-8 g of protein.  Pork tenderloin (cooked) -- 3 oz has 18.4 g of protein.  Pumpkin seeds -- 1 oz has 8.5 g of protein.  Soybeans (roasted) -- 1 oz has 8 g of protein.  Soybeans (cooked) -- 1/2 cup has 11 g of protein.  Soy milk -- 1 cup (8 oz) has 5-10 g of protein.  Soy or vegetable patty -- 1 patty has 11 g of protein.  Sunflower seeds -- 1 oz has 5.5 g of protein.  Tofu (firm) -- 1/2 cup has 20 g of protein.  Tuna (canned in water) -- 3 oz has 20 g of protein.  Yogurt -- 6 oz has 8 g of protein. Low-protein foods Low-protein foods contain 3 grams (3 g) or less of protein per serving. They include:  Beets (raw or cooked) -- 1/2 cup has 1.5 g of protein.  Bran cereal -- 1/2 cup has 2-3 g of protein.  Bread -- 1 slice has 2.5 g of protein.  Broccoli (raw or cooked) -- 1/2 cup has 2 g of protein.  Collard  greens (raw or cooked) -- 1/2 cup has 2 g of protein.  Corn (fresh or cooked) -- 1/2 cup has 2 g of protein.  Cream cheese -- 1 oz has 2 g of protein.  Creamer (half-and-half) -- 1 oz has 1 g of protein.  Flour tortilla -- 1 tortilla has 2.5 g of protein  Frozen yogurt -- 1/2 cup has 3 g of protein.  Fruit or vegetable juice -- 1/2 cup has 1 g of protein.  Green beans (raw or cooked) -- 1/2 cup has 1 g of protein.  Green peas (canned) -- 1/2 cup has 3.5 g of protein.  Muffins -- 1 small muffin (2 oz) has 3 g of protein.  Oatmeal (cooked) -- 1/2 cup has 3 g of protein.  Potato (baked with skin) -- 1 medium potato has 3 g of protein.  Rice (cooked) -- 1/2 cup has 2.5-3.5 g of protein.  Sour cream -- 1/2 cup has 2.5 g of protein.  Spinach (cooked) -- 1/2 cup has 3 g of protein.  Squash (cooked) -- 1/2 cup has 1.5 g of protein. Actual amounts of protein may  be different depending on processing. Talk with your health care provider or dietitian about what foods are recommended for you. This information is not intended to replace advice given to you by your health care provider. Make sure you discuss any questions you have with your health care provider. Document Revised: 07/05/2016 Document Reviewed: 07/05/2016 Elsevier Patient Education  [place], Adult A ketogenic eating plan is a diet that is very low in carbohydrates, moderately low in protein, and very high in fat. Your body normally gets energy from eating carbohydrates. If you limit carbohydrates in your diet, your body will start to use stored fat as an energy source. When fat is broken down for energy, it enters your blood as a substance called ketones. A ketogenic eating plan relies mostly on ketones for energy. This eating plan can cause rapid weight loss because the body burns fat for fuel. What are the benefits of a ketogenic eating plan? Epilepsy A ketogenic diet is sometimes used  to treat epilepsy in children who have seizures that are not helped by medicines. As a treatment for epilepsy in children, ketogenic eating plans have been well studied and used for many years. This type of diet is usually started in the hospital and carefully monitored by a health care team. It is usually tried for several months to see if it will lessen seizures. Other conditions Ketogenic eating plans have also been studied to help treat other conditions, including:  Cancer.  Type 2 diabetes.  Alzheimer's disease.  Parkinson's disease.  Autism.  Multiple sclerosis. More studies need to be done to see how well this eating plan works for these and other conditions and what the long-term side effects might be. What are the side effects of a ketogenic eating plan? Side effects of a ketogenic diet may include:  Vitamin deficiencies.  Loss of body fluids (dehydration).  Bad breath.  Nausea.  Hunger.  Difficulty passing stool (constipation).  Problems with menstrual periods.  Inflammation of the pancreas.  Kidney stones or gall bladder stones.  Bone fractures.  High cholesterol. What are tips for following this plan? Reading food labels  Look for foods that have a low glycemic index (GI) label.  Read food ingredient lists to check for any hidden or added sugar.  Check food labels for the number of grams of carbohydrates and protein. This ensures that you are eating the right amounts of these nutrients. This is important. Cooking  Carefully measure or weigh foods.  Make desserts using ketogenic or low GI recipes.  Avoid cooking with sauces that contain added sugar, such as barbecue sauce or ketchup. Meal planning  Aim for a daily meal and snack time schedule that you can follow consistently.  Every meal will include high-fat items such as avocado, cream, butter, or mayonnaise. General information   Buy a gram scale to weigh foods. This is necessary to follow  this diet correctly and accurately. Your dietitian will teach you how to use one for this diet.  Ask your health care provider what steps you can take to avoid side effects of this eating plan, such as constipation or kidney stones.  Take vitamin and mineral supplements only as told by a health care provider or dietitian.  Work with your dietitian and health care provider to handle the key challenges of this diet and to help you stay on track. What foods should I eat?  Fruits Fresh pineapple, peaches, and apples. Other fresh  and frozen fruits in small amounts. Vegetables Lettuce. Beets. Bok choy. Eggplant. Tomatoes. Turnips. Cucumbers. Peppers. Radishes. Cauliflower. Zucchini. Fennel. Swiss chard. Grains Whole wheat bread. Bran cereal. Brown rice. Whole wheat pasta. Low GI cereals. Meats and other proteins Meat, poultry, and fish. Eggs. Egg substitutes. Nuts, seeds, lentils, and split peas in small amounts. Berniece Salines. Dairy Cheese in moderate amounts. Beverages Plain water. Sugar-free, caffeine-free beverages. Mineral water or club soda. Caffeine-free, carbohydrate-free herbal tea. Fats and oils Avocado. Cream. Sour cream. Cream cheese. Butter. Plant-based oils, such as olive, canola, and sunflower. Margarine. Mayonnaise. Sweets and desserts Any homemade sweets or desserts made using ketogenic diet recipes. The items listed above may not be a complete list of recommended foods and beverages. Contact a dietitian for more information. What foods should I avoid? Fruits Fruit juice. Fruits packed in syrups. Dried or candied fruits. Vegetables Corn. Potatoes. Peas. Grains All bread, dry cereals, and cooked cereals with added sugar. Baked goods. Crackers and pretzels. Meats and other proteins Meat, poultry, or fish prepared with flour or breading. Nut butters with added sugar. Beans. Dairy Milk. Yogurt. Fats and oils Salad dressings with added sugar. Gravies. Beverages Sugar-sweetened  teas, coffee drinks, or soft drinks. Juice. Sports drinks. Sweets and desserts All sweets and desserts, unless the dessert is homemade using ketogenic diet recipes. The items listed above may not be a complete list of foods and beverages to avoid. Contact a dietitian for more information. Summary  A ketogenic eating plan is a diet that is very low in carbohydrates, moderately low in protein, and very high in fat.  Aim for a daily meal and snack time schedule that you can follow consistently.  Buy a gram scale to weigh foods. This is necessary to follow this diet correctly and accurately. Your dietitian will teach you how to use one for this diet.  Work closely with your health care provider and a dietitian while you are following a ketogenic eating plan. This information is not intended to replace advice given to you by your health care provider. Make sure you discuss any questions you have with your health care provider. Document Revised: 03/02/2018 Document Reviewed: 03/02/2018 Elsevier Patient Education  [place].   Preventing Unhealthy [employer], Adult Staying at a healthy weight is important to your overall health. When fat builds up in your body, you may become overweight or obese. Being overweight or obese increases your risk of developing certain health problems, such as heart disease, diabetes, sleeping problems, joint problems, and some types of cancer. Unhealthy weight gain is often the result of making unhealthy food choices or not getting enough exercise. You can make changes to your lifestyle to prevent obesity and stay as healthy as possible. What nutrition changes can be made?   Eat only as much as your body needs. To do this: ? Pay attention to signs that you are hungry or full. Stop eating as soon as you feel full. ? If you feel hungry, try drinking water first before eating. Drink enough water so your urine is clear or pale yellow. ? Eat smaller portions. Pay  attention to portion sizes when eating out. ? Look at serving sizes on food labels. Most foods contain more than one serving per container. ? Eat the recommended number of calories for your gender and activity level. For most active people, a daily total of 2,000 calories is appropriate. If you are trying to lose weight or are not very active, you may need to eat  fewer calories. Talk with your health care provider or a diet and nutrition specialist (dietitian) about how many calories you need each day.  Choose healthy foods, such as: ? Fruits and vegetables. At each meal, try to fill at least half of your plate with fruits and vegetables. ? Whole grains, such as whole-wheat bread, brown rice, and quinoa. ? Lean meats, such as chicken or fish. ? Other healthy proteins, such as beans, eggs, or tofu. ? Healthy fats, such as nuts, seeds, fatty fish, and olive oil. ? Low-fat or fat-free dairy products.  Check food labels, and avoid food and drinks that: ? Are high in calories. ? Have added sugar. ? Are high in sodium. ? Have saturated fats or trans fats.  Cook foods in healthier ways, such as by baking, broiling, or grilling.  Make a meal plan for the week, and shop with a grocery list to help you stay on track with your purchases. Try to avoid going to the grocery store when you are hungry.  When grocery shopping, try to shop around the outside of the store first, where the fresh foods are. Doing this helps you to avoid prepackaged foods, which can be high in sugar, salt (sodium), and fat. What lifestyle changes can be made?   Exercise for 30 or more minutes on 5 or more days each week. Exercising may include brisk walking, yard work, biking, running, swimming, and team sports like basketball and soccer. Ask your health care provider which exercises are safe for you.  Do muscle-strengthening activities, such as lifting weights or using resistance bands, on 2 or more days a week.  Do not use  any products that contain nicotine or tobacco, such as cigarettes and e-cigarettes. If you need help quitting, ask your health care provider.  Limit alcohol intake to no more than 1 drink a day for nonpregnant women and 2 drinks a day for men. One drink equals 12 oz of beer, 5 oz of wine, or 1 oz of hard liquor.  Try to get 7-9 hours of sleep each night. What other changes can be made?  Keep a food and activity journal to keep track of: ? What you ate and how many calories you had. Remember to count the calories in sauces, dressings, and side dishes. ? Whether you were active, and what exercises you did. ? Your calorie, weight, and activity goals.  Check your weight regularly. Track any changes. If you notice you have gained weight, make changes to your diet or activity routine.  Avoid taking weight-loss medicines or supplements. Talk to your health care provider before starting any new medicine or supplement.  Talk to your health care provider before trying any new diet or exercise plan. Why are these changes important? Eating healthy, staying active, and having healthy habits can help you to prevent obesity. Those changes also:  Help you manage stress and emotions.  Help you connect with friends and family.  Improve your self-esteem.  Improve your sleep.  Prevent long-term health problems. What can happen if changes are not made? Being obese or overweight can cause you to develop joint or bone problems, which can make it hard for you to stay active or do activities you enjoy. Being obese or overweight also puts stress on your heart and lungs and can lead to health problems like diabetes, heart disease, and some cancers. Where to find more information Talk with your health care provider or a dietitian about healthy eating and healthy lifestyle  choices. You may also find information from:  U.[place]. Department of Agriculture, MyPlate: FormerBoss.no  American Heart Association:  www.heart.org  Centers for Disease Control and Prevention: http://www.wolf.info/ Summary  Staying at a healthy weight is important to your overall health. It helps you to prevent certain diseases and health problems, such as heart disease, diabetes, joint problems, sleep disorders, and some types of cancer.  Being obese or overweight can cause you to develop joint or bone problems, which can make it hard for you to stay active or do activities you enjoy.  You can prevent unhealthy weight gain by eating a healthy diet, exercising regularly, not smoking, limiting alcohol, and getting enough sleep.  Talk with your health care provider or a dietitian for guidance about healthy eating and healthy lifestyle choices. This information is not intended to replace advice given to you by your health care provider. Make sure you discuss any questions you have with your health care provider. Document Revised: 10/28/2017 Document Reviewed: 12/01/2016 Elsevier Patient Education  2020 [employer].

## 2020-02-05 ENCOUNTER — Ambulatory Visit (INDEPENDENT_AMBULATORY_CARE_PROVIDER_SITE_OTHER): Payer: Medicaid Other | Admitting: Obstetrics and Gynecology

## 2020-02-05 ENCOUNTER — Encounter: Payer: Self-pay | Admitting: Obstetrics and Gynecology

## 2020-02-05 ENCOUNTER — Other Ambulatory Visit: Payer: Self-pay

## 2020-02-05 VITALS — BP 111/76 | HR 96 | Ht 62.0 in | Wt 183.1 lb

## 2020-02-05 DIAGNOSIS — Z7689 Persons encountering health services in other specified circumstances: Secondary | ICD-10-CM

## 2020-02-05 DIAGNOSIS — N3281 Overactive bladder: Secondary | ICD-10-CM | POA: Diagnosis not present

## 2020-02-05 DIAGNOSIS — N951 Menopausal and female climacteric states: Secondary | ICD-10-CM | POA: Diagnosis not present

## 2020-02-05 DIAGNOSIS — Z87440 Personal history of urinary (tract) infections: Secondary | ICD-10-CM

## 2020-02-05 MED ORDER — POLYETHYLENE GLYCOL 3350 17 G PO PACK: 17.0000 g | PACK | Freq: Every day | ORAL | 0 refills | Status: DC

## 2020-02-05 NOTE — Progress Notes (Signed)
    GYNECOLOGY PROGRESS NOTE  Subjective:    Patient ID: Natalie [name], female    DOB: 1976-07-16, 44 y.o.   MRN: [id number]  HPI  Patient is a 44 y.o. [id number] female who presents for f/u of bladder symptoms (urinary frequency, OAB symptoms). Notes that overall she has been doing better since starting the Shub[place] and taking the extended antibiotic course recommended by Urology.  Denies complaints today.   She also recently started new weight loss medication last week. Has been doing well on the Phentermine. Notes that sometimes it makes her feel tired. Is doing better with managing her diet. Has already lost 2 lbs this week.   Lastly, she reports vaginal spotting. Patient's last menstrual period was 01/19/2020. Spotting has occurred 3 times this month, most recent episode occurred yesterday.    The following portions of the patient's history were reviewed and updated as appropriate: allergies, current medications, past family history, past medical history, past social history, past surgical history and problem list.  Review of Systems Pertinent items noted in HPI and remainder of comprehensive ROS otherwise negative.   Objective:   Blood pressure 111/76, pulse 96, height 5' 2"  (1.575 m), weight 183 lb 1.6 oz (83.1 kg), last menstrual period 01/19/2020. General appearance: alert and no distress Abdomen: soft, non-tender.  Waist circumference 40.5 cm.  Remainder of exam deferred.    Assessment:   Overactive bladder History of UTI Perimenopausal Encounter for weight management  Plan:   1. Overactive bladder symptoms improving. Continue Toviaz.  2. History of UTI, currently on Macrobid suppression for 3 months, recommended by Urology.  3. Perimenopausal (patient with history of occasional cycles and spotting). Currently using Premarin cream externally to urethra daily. Advised to decrease to twice weekly, but use internally as well as she has a history of vaginal atrophy (mild). To  f/u if symptoms persist.  4. Weight management - patient has recently started Phentermine for weight loss. Doing well with managing diet and exercise. Reiterated goal of 10% loss of total body weight by 3 months. Patient notes understanding. RTC in 1 month for weight check.    Rubie Maid, MD [facility]

## 2020-02-05 NOTE — Progress Notes (Signed)
Pt present for medication review. Pt stated that the phentermine is causing her to be sleepy no other side effects.

## 2020-02-07 ENCOUNTER — Encounter: Payer: Self-pay | Admitting: Obstetrics and Gynecology

## 2020-02-07 ENCOUNTER — Telehealth: Payer: Self-pay | Admitting: *Deleted

## 2020-02-07 ENCOUNTER — Other Ambulatory Visit: Payer: Medicaid Other

## 2020-02-07 ENCOUNTER — Other Ambulatory Visit: Payer: Self-pay

## 2020-02-07 DIAGNOSIS — N390 Urinary tract infection, site not specified: Secondary | ICD-10-CM | POA: Diagnosis not present

## 2020-02-07 DIAGNOSIS — N3281 Overactive bladder: Secondary | ICD-10-CM

## 2020-02-07 NOTE — Telephone Encounter (Signed)
Patient called Triage line reports symptoms of dysuria and urgency. Denies any other symptoms. Oked per [employer], [place] to provide urine sample. Scheduled Lab appointment. Patient aware.

## 2020-02-08 LAB — URINALYSIS, COMPLETE
Bilirubin, UA: NEGATIVE
Glucose, UA: NEGATIVE
Ketones, UA: NEGATIVE
Leukocytes,UA: NEGATIVE
Nitrite, UA: NEGATIVE
Protein,UA: NEGATIVE
Specific Gravity, UA: 1.02 (ref 1.005–1.030)
Urobilinogen, Ur: 0.2 mg/dL (ref 0.2–1.0)
pH, UA: 7 (ref 5.0–7.5)

## 2020-02-08 LAB — MICROSCOPIC EXAMINATION: Epithelial Cells (non renal): >10 /hpf — AB (ref 0–10)

## 2020-02-11 ENCOUNTER — Telehealth: Payer: Self-pay | Admitting: Urology

## 2020-02-11 LAB — CULTURE, URINE COMPREHENSIVE

## 2020-02-11 NOTE — Telephone Encounter (Signed)
U/S said If they see the pt. has a follow up appointment here in office they schedule the U/S as close to the appointment date as possible and thats the only reason they have not called the pt. to schedule. Her appt. here is May 17,2021 ao they will not call to schedule until closer to that date.  Patient urine culture is negative. Left message for patient.

## 2020-02-11 NOTE — Telephone Encounter (Signed)
Pt is calling to check on Urine sample results, she also states she has not heard from the U/S dept. Regarding her U/S of her Kidney. Please call pt

## 2020-02-14 ENCOUNTER — Other Ambulatory Visit: Payer: Self-pay

## 2020-02-14 ENCOUNTER — Telehealth: Payer: Self-pay | Admitting: Urology

## 2020-02-14 ENCOUNTER — Ambulatory Visit (INDEPENDENT_AMBULATORY_CARE_PROVIDER_SITE_OTHER): Payer: Medicaid Other | Admitting: Physician Assistant

## 2020-02-14 VITALS — BP 114/84 | HR 112 | Ht 64.0 in | Wt 183.0 lb

## 2020-02-14 DIAGNOSIS — N3281 Overactive bladder: Secondary | ICD-10-CM

## 2020-02-14 DIAGNOSIS — R300 Dysuria: Secondary | ICD-10-CM

## 2020-02-14 LAB — BLADDER SCAN AMB NON-IMAGING: Scan Result: 29

## 2020-02-14 LAB — URINALYSIS, COMPLETE
Bilirubin, UA: NEGATIVE
Glucose, UA: NEGATIVE
Ketones, UA: NEGATIVE
Leukocytes,UA: NEGATIVE
Nitrite, UA: NEGATIVE
Protein,UA: NEGATIVE
RBC, UA: NEGATIVE
Specific Gravity, UA: 1.015 (ref 1.005–1.030)
Urobilinogen, Ur: 0.2 mg/dL (ref 0.2–1.0)
pH, UA: 7.5 (ref 5.0–7.5)

## 2020-02-14 LAB — MICROSCOPIC EXAMINATION: RBC, Urine: NONE SEEN /hpf (ref 0–2)

## 2020-02-14 NOTE — Progress Notes (Signed)
02/14/2020 1:24 PM   [name] [birth year] [id number]  CC: Urgency, frequency, dysuria  HPI: Natalie [name] is a 44 y.o. female who presents today for evaluation of possible UTI. She is an established [facility] patient who last saw Dr. Matilde Sprang on 01/28/2020 for evaluation of OAB versus recurrent UTI.  She was started on Macrobid 100 mg daily at that time and counseled to stay on [place] for management of OAB.  Renal ultrasound ordered for further evaluation of structural causes of cystitis.  She is scheduled to follow-up with Dr. Matilde Sprang on 03/24/2020.  Today, she reports continued urgency, frequency, and dysuria on Toviaz.  She called our office 1 week ago with reports of dysuria and urgency and dropped off a urine sample.  Urine culture negative.  She states her urinary symptoms are stable and not acutely worsening, however she is becoming increasingly frustrated with them.  Additionally, she notes constipation with Toviaz.  She denies bladder pain at baseline.  In-office UA today pan negative; urine microscopy with moderate bacteria. PVR 34m.  PMH: Past Medical History:  Diagnosis Date  . Dysplasia of cervix, low grade (CIN 1) 02/15/2017   Will need repeat pap smear in 1 year.   .Marland KitchenHistory of Graves' disease   . Hyperlipemia   . Hypertension   . Marfan syndrome   . Ovarian cyst   . Vitamin D deficiency     Surgical History: Past Surgical History:  Procedure Laterality Date  . DILATION AND CURETTAGE OF UTERUS    . TONSILECTOMY/ADENOIDECTOMY WITH MYRINGOTOMY    . TUBAL LIGATION      Home Medications:  Allergies as of 02/14/2020      Reactions   Hydrocodone-acetaminophen Itching   Oxycodone-acetaminophen Other (See Comments), Itching      Medication List       Accurate as of February 14, 2020  1:24 PM. If you have any questions, ask your nurse or doctor.        atorvastatin 40 MG tablet Commonly known as: LIPITOR Take by mouth.   buPROPion 300 MG 24 hr  tablet Commonly known as: WELLBUTRIN XL Take by mouth.   busPIRone 10 MG tablet Commonly known as: BUSPAR Take 10 mg by mouth 2 (two) times daily.   conjugated estrogens vaginal cream Commonly known as: PREMARIN Place 1 Applicatorful vaginally daily.   fesoterodine 4 MG Tb24 tablet Commonly known as: TOVIAZ Take 1 tablet (4 mg total) by mouth daily.   Fish Oil 1000 MG Caps Take by mouth.   hydrOXYzine 25 MG tablet Commonly known as: ATARAX/VISTARIL Take 25 mg by mouth 3 (three) times daily as needed.   levothyroxine 50 MCG tablet Commonly known as: SYNTHROID Take 50 mcg by mouth daily before breakfast.   pantoprazole 40 MG tablet Commonly known as: PROTONIX Take 40 mg by mouth daily.   phentermine 30 MG capsule Take 1 capsule (30 mg total) by mouth every morning. Begin this medication first   phentermine 37.5 MG capsule Take 1 capsule (37.5 mg total) by mouth every morning. Begin after completing 30 mg dosing.   polyethylene glycol 17 g packet Commonly known as: MiraLax Take 17 g by mouth daily.   PROBIOTIC DAILY PO Take by mouth.   Stool Softener 100 MG capsule Generic drug: docusate sodium Take 100 mg by mouth 2 (two) times daily.   traZODone 50 MG tablet Commonly known as: DESYREL Take 50 mg by mouth at bedtime. 100 mg at bedtime   trimethoprim 100 MG tablet  Commonly known as: TRIMPEX Take 1 tablet (100 mg total) by mouth daily.   Vitamin D (Ergocalciferol) 1.25 MG (50000 UNIT) Caps capsule Commonly known as: DRISDOL Take 1 capsule (50,000 Units total) by mouth once a week.       Allergies:  Allergies  Allergen Reactions  . Hydrocodone-Acetaminophen Itching  . Oxycodone-Acetaminophen Other (See Comments) and Itching    Family History: Family History  Problem Relation Age of Onset  . Hyperlipidemia Mother   . Hyperthyroidism Mother   . Thyroid cancer Father   . Marfan syndrome Father   . Marfan syndrome Sister   . Depression Sister   .  Depression Brother   . Breast cancer Paternal Grandmother        great PGM    Social History:   reports that she quit smoking about 3 years ago. Her smoking use included cigarettes. She has a 5.00 pack-year smoking history. She has never used smokeless tobacco. She reports current alcohol use. She reports that she does not use drugs.  Physical Exam: BP 114/84   Pulse (!) 112   Ht 5' 4"  (1.626 m)   Wt 183 lb (83 kg)   LMP 01/19/2020   BMI 31.41 kg/m   Constitutional:  Alert and oriented, no acute distress, nontoxic appearing HEENT: [place], AT Cardiovascular: No clubbing, cyanosis, or edema Respiratory: Normal respiratory effort, no increased work of breathing Skin: No rashes, bruises or suspicious lesions Neurologic: Grossly intact, no focal deficits, moving all 4 extremities Psychiatric: Normal mood and affect  Laboratory Data: Results for orders placed or performed in visit on 02/14/20  Microscopic Examination   URINE  Result Value Ref Range   WBC, UA 0-5 0 - 5 /hpf   RBC None seen 0 - 2 /hpf   Epithelial Cells (non renal) 0-10 0 - 10 /hpf   Bacteria, UA Moderate (A) None seen/Few  Urinalysis, Complete  Result Value Ref Range   Specific Gravity, UA 1.015 1.005 - 1.030   pH, UA 7.5 5.0 - 7.5   Color, UA Yellow Yellow   Appearance Ur Clear Clear   Leukocytes,UA Negative Negative   Protein,UA Negative Negative/Trace   Glucose, UA Negative Negative   Ketones, UA Negative Negative   RBC, UA Negative Negative   Bilirubin, UA Negative Negative   Urobilinogen, Ur 0.2 0.2 - 1.0 mg/dL   Nitrite, UA Negative Negative   Microscopic Examination See below:   BLADDER SCAN AMB NON-IMAGING  Result Value Ref Range   Scan Result 29 ML    Assessment & Plan:   1. Dysuria 44 year old female with a history of OAB presents with stable symptoms of dysuria, urgency, and frequency despite Toviaz.  UA reassuring for infection, will send for culture to confirm.  Symptoms likely secondary to #2  below. - Urinalysis, Complete - Microscopic Examination - CULTURE, URINE COMPREHENSIVE  2. OAB (overactive bladder) PVR WNL on Toviaz.  Due to persistent, bothersome symptoms, provided patient with 5 weeks of Myrbetriq 50 mg samples today with plans for follow-up at her next scheduled visit with Dr. Matilde Sprang next month.  If Myrbetriq does not offer therapeutic improvement or there are insurance coverage issues, may consider alternative therapies for OAB including PTNS, Botox, or InterStim. - BLADDER SCAN AMB NON-IMAGING   Debroah Loop, PA-C  [facility] [address] [place],  [address] [phone]

## 2020-02-14 NOTE — Telephone Encounter (Signed)
Spoke with patient she did have a culture done a week ago and it was negative. She is still having symptoms. Added on to PA schedule for further evaluation

## 2020-02-14 NOTE — Patient Instructions (Signed)
Stop Toviaz. Start Myrbetriq with plans to discuss your progress with Dr. Matilde Sprang at your next appointment next month. Continue nitrofurantoin.

## 2020-02-14 NOTE — Telephone Encounter (Signed)
Pt calling office stating that she is still getting up at night 3-4 times with urge to urinate, sometimes not able to go just feels like she needs to go. Pt wants some advice as to what may be going on. Please advise pt at [phone].

## 2020-02-15 DIAGNOSIS — R5383 Other fatigue: Secondary | ICD-10-CM | POA: Diagnosis not present

## 2020-02-15 DIAGNOSIS — M545 Low back pain: Secondary | ICD-10-CM | POA: Diagnosis not present

## 2020-02-15 DIAGNOSIS — J309 Allergic rhinitis, unspecified: Secondary | ICD-10-CM | POA: Diagnosis not present

## 2020-02-15 DIAGNOSIS — E785 Hyperlipidemia, unspecified: Secondary | ICD-10-CM | POA: Diagnosis not present

## 2020-02-18 ENCOUNTER — Telehealth: Payer: Self-pay | Admitting: Urology

## 2020-02-18 LAB — CULTURE, URINE COMPREHENSIVE

## 2020-02-18 NOTE — Telephone Encounter (Signed)
Please contact the patient and inform her that her urine culture resulted negative.  She does not have a UTI.  I recommend she keep her follow-up appointment with Dr. Arther Dames next month to discuss alternative therapies for her overactive bladder.  If she is having side effects from the Myrbetriq, I recommend that she stop the medication at this time.  If she does not feel better after stopping the medication, she should follow-up with her PCP for further evaluation.

## 2020-02-18 NOTE — Telephone Encounter (Signed)
Pt calls informed her of the information below. Pt gave verbal understanding and will discontinue Myrbetriq

## 2020-02-18 NOTE — Telephone Encounter (Signed)
Pt is calling she is taking rx Myrvetriq and it is making her nauseas in the morning she would like to know if that will go away she also feels weak but she does not think it is related please call pt

## 2020-02-25 DIAGNOSIS — H0288B Meibomian gland dysfunction left eye, upper and lower eyelids: Secondary | ICD-10-CM | POA: Diagnosis not present

## 2020-03-05 ENCOUNTER — Encounter: Payer: Medicaid Other | Admitting: Obstetrics and Gynecology

## 2020-03-10 DIAGNOSIS — H5213 Myopia, bilateral: Secondary | ICD-10-CM | POA: Diagnosis not present

## 2020-03-11 DIAGNOSIS — H5213 Myopia, bilateral: Secondary | ICD-10-CM | POA: Diagnosis not present

## 2020-03-18 DIAGNOSIS — M25561 Pain in right knee: Secondary | ICD-10-CM | POA: Diagnosis not present

## 2020-03-18 DIAGNOSIS — R42 Dizziness and giddiness: Secondary | ICD-10-CM | POA: Diagnosis not present

## 2020-03-18 DIAGNOSIS — F419 Anxiety disorder, unspecified: Secondary | ICD-10-CM | POA: Diagnosis not present

## 2020-03-18 DIAGNOSIS — M545 Low back pain: Secondary | ICD-10-CM | POA: Diagnosis not present

## 2020-03-20 ENCOUNTER — Other Ambulatory Visit: Payer: Self-pay

## 2020-03-20 ENCOUNTER — Ambulatory Visit
Admission: RE | Admit: 2020-03-20 | Discharge: 2020-03-20 | Disposition: A | Discharge status: Home / Self Care | Payer: Medicaid Other | Source: Ambulatory Visit | Attending: Urology | Admitting: Urology

## 2020-03-20 DIAGNOSIS — N390 Urinary tract infection, site not specified: Secondary | ICD-10-CM | POA: Insufficient documentation

## 2020-03-24 ENCOUNTER — Ambulatory Visit (INDEPENDENT_AMBULATORY_CARE_PROVIDER_SITE_OTHER): Payer: Medicaid Other | Admitting: Urology

## 2020-03-24 ENCOUNTER — Other Ambulatory Visit: Payer: Self-pay

## 2020-03-24 VITALS — BP 116/82 | HR 79 | Wt 170.0 lb

## 2020-03-24 DIAGNOSIS — N3946 Mixed incontinence: Secondary | ICD-10-CM | POA: Diagnosis not present

## 2020-03-24 MED ORDER — MIRABEGRON ER 50 MG PO TB24: 50.0000 mg | ORAL_TABLET | Freq: Every day | ORAL | 3 refills | Status: DC

## 2020-03-24 MED ORDER — TRIMETHOPRIM 100 MG PO TABS: 100.0000 mg | ORAL_TABLET | Freq: Every day | ORAL | 3 refills | Status: DC

## 2020-03-24 NOTE — Progress Notes (Signed)
03/24/2020 9:01 AM   Natalie [name] 1976/07/13 [id number]  Referring provider: L[address], [place],  [place] [address]  Chief Complaint  Patient presents with  . Over Active Bladder    HPI: I was consulted to assess the patient for possible overactive bladder or urinary tract infections.  She thinks he gets 8 infections a year.  She will get a strong urge and then she sit and cannot urinate.  She will get a tingling feeling in the vaginal area.  The symptoms seem to be more from a possible infection and they usually temporarily respond to an antibiotic.  I think she still [name] some of the symptoms when she is not infected.  She voids every 1-2 hours gets up once at night.  She is continent.  She is on Toviaz which is helping some.  She quit smoking 3 years ago.  It appears that she tried Vesicare and oxybutynin.  She still [name] some urgency but overall the Lisbeth Ply [name] helped  Well Supported bladder neck and no stress incontinence  Pathophysiology of chronic cystitis discussed.  Probiotics and suppression therapy discussed.  Reassess in 8 weeks on trimethoprim 100 mg with 30x11.  Stay on [place].  If she does down regulate consider stopping the Toviaz.  Call if urine culture positive.  Call if ultrasound normal  Today Renal ultrasound normal.  Urine culture from March visit normal.  2 urine culture since normal.  Patient states she is doing great on the Myrbetriq with much less frequency and urgency.  Infection free on trimethoprim.  Very pleased.   PMH: Past Medical History:  Diagnosis Date  . Dysplasia of cervix, low grade (CIN 1) 02/15/2017   Will need repeat pap smear in 1 year.   Marland Kitchen History of Graves' disease   . Hyperlipemia   . Hypertension   . Marfan syndrome   . Ovarian cyst   . Vitamin D deficiency     Surgical History: Past Surgical History:  Procedure Laterality Date  . DILATION AND CURETTAGE OF UTERUS    . TONSILECTOMY/ADENOIDECTOMY WITH  MYRINGOTOMY    . TUBAL LIGATION      Home Medications:  Allergies as of 03/24/2020      Reactions   Hydrocodone-acetaminophen Itching   Oxycodone-acetaminophen Other (See Comments), Itching      Medication List       Accurate as of Mar 24, 2020  9:01 AM. If you have any questions, ask your nurse or doctor.        STOP taking these medications   polyethylene glycol 17 g packet Commonly known as: MiraLax Stopped by: Reece Packer, MD   Stool Softener 100 MG capsule Generic drug: docusate sodium Stopped by: Reece Packer, MD     TAKE these medications   atorvastatin 40 MG tablet Commonly known as: LIPITOR Take by mouth.   buPROPion 300 MG 24 hr tablet Commonly known as: WELLBUTRIN XL Take by mouth.   busPIRone 10 MG tablet Commonly known as: BUSPAR Take 10 mg by mouth 2 (two) times daily.   conjugated estrogens vaginal cream Commonly known as: PREMARIN Place 1 Applicatorful vaginally daily.   doxycycline 100 MG tablet Commonly known as: VIBRA-TABS SMARTSIG:1 Tablet(s) By Mouth Every 12 Hours   fesoterodine 4 MG Tb24 tablet Commonly known as: TOVIAZ Take 1 tablet (4 mg total) by mouth daily.   Fish Oil 1000 MG Caps Take by mouth.   hydrOXYzine 25 MG tablet Commonly known as: ATARAX/VISTARIL  Take 25 mg by mouth 3 (three) times daily as needed.   levothyroxine 50 MCG tablet Commonly known as: SYNTHROID Take 50 mcg by mouth daily before breakfast.   pantoprazole 40 MG tablet Commonly known as: PROTONIX Take 40 mg by mouth daily.   phentermine 30 MG capsule Take 1 capsule (30 mg total) by mouth every morning. Begin this medication first   phentermine 37.5 MG capsule Take 1 capsule (37.5 mg total) by mouth every morning. Begin after completing 30 mg dosing.   PROBIOTIC DAILY PO Take by mouth.   traZODone 50 MG tablet Commonly known as: DESYREL Take 50 mg by mouth at bedtime. 100 mg at bedtime   trimethoprim 100 MG tablet Commonly known  as: TRIMPEX Take 1 tablet (100 mg total) by mouth daily.   Vitamin D (Ergocalciferol) 1.25 MG (50000 UNIT) Caps capsule Commonly known as: DRISDOL Take 1 capsule (50,000 Units total) by mouth once a week.       Allergies:  Allergies  Allergen Reactions  . Hydrocodone-Acetaminophen Itching  . Oxycodone-Acetaminophen Other (See Comments) and Itching    Family History: Family History  Problem Relation Age of Onset  . Hyperlipidemia Mother   . Hyperthyroidism Mother   . Thyroid cancer Father   . Marfan syndrome Father   . Marfan syndrome Sister   . Depression Sister   . Depression Brother   . Breast cancer Paternal Grandmother        great PGM    Social History:  reports that she quit smoking about 3 years ago. Her smoking use included cigarettes. [name] a 5.00 pack-year smoking history. [name] never used smokeless tobacco. She reports current alcohol use. She reports that she does not use drugs.  ROS:                                        Physical Exam: BP 116/82   Pulse 79   Wt 170 lb (77.1 kg)   BMI 29.18 kg/m     Laboratory Data: No results found for: WBC, HGB, HCT, MCV, PLT  No results found for: CREATININE  No results found for: PSA  No results found for: TESTOSTERONE  No results found for: HGBA1C  Urinalysis    Component Value Date/Time   COLORURINE Yellow 10/09/2013 0859   APPEARANCEUR Clear 02/14/2020 1316   LABSPEC 1.006 10/09/2013 0859   PHURINE 6.0 10/09/2013 0859   GLUCOSEU Negative 02/14/2020 1316   GLUCOSEU Negative 10/09/2013 0859   HGBUR Negative 10/09/2013 0859   BILIRUBINUR Negative 02/14/2020 1316   BILIRUBINUR Negative 10/09/2013 0859   KETONESUR Negative 10/09/2013 0859   PROTEINUR Negative 02/14/2020 1316   PROTEINUR Negative 10/09/2013 0859   UROBILINOGEN 0.2 11/23/2019 1349   NITRITE Negative 02/14/2020 1316   NITRITE Negative 10/09/2013 0859   LEUKOCYTESUR Negative 02/14/2020 1316   LEUKOCYTESUR  Negative 10/09/2013 0859    Pertinent Imaging:   Assessment & Plan: Clinically infection free.  Overactive bladder much improved.  Renew Myrbetriq and trimethoprim 90x3 and recheck in 6 months  There are no diagnoses linked to this encounter.  No follow-ups on file.  Reece Packer, MD  [facility] [address] [place], [place] [address] [phone]

## 2020-03-26 MED ORDER — PHENTERMINE HCL 30 MG PO CAPS: 30.0000 mg | ORAL_CAPSULE | ORAL | 0 refills | Status: DC

## 2020-03-31 ENCOUNTER — Telehealth: Payer: Self-pay | Admitting: Urology

## 2020-03-31 NOTE — Telephone Encounter (Signed)
Patient called office today with complaints of urgency that started last night. Patient is currently taking Myrbetriq and Trimethoprim and wants to be sure she does not have an infection. Appointment was made to have urine checked.

## 2020-03-31 NOTE — Telephone Encounter (Signed)
Pt. Has questions about medication that she forgot to discuss at her appt. with Dr. Matilde Sprang on 03/24/20.

## 2020-04-01 ENCOUNTER — Ambulatory Visit (INDEPENDENT_AMBULATORY_CARE_PROVIDER_SITE_OTHER): Payer: Medicaid Other | Admitting: Physician Assistant

## 2020-04-01 ENCOUNTER — Other Ambulatory Visit: Payer: Self-pay

## 2020-04-01 ENCOUNTER — Encounter: Payer: Self-pay | Admitting: Physician Assistant

## 2020-04-01 VITALS — BP 106/77 | HR 93 | Ht 64.0 in | Wt 173.6 lb

## 2020-04-01 DIAGNOSIS — R3915 Urgency of urination: Secondary | ICD-10-CM | POA: Diagnosis not present

## 2020-04-01 NOTE — Progress Notes (Signed)
04/01/2020 6:34 PM   [name] [birth year] [id number]  CC: Chief Complaint  Patient presents with  . Urinary Urgency    HPI: Natalie [name] is a 44 y.o. 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 She is an established [facility] patient who last saw Dr. Matilde Sprang on 03/24/2020 for routine follow-up.  Today, patient reports a 4 to 5-day history of urinary urgency and chills.  She denies fever, vomiting, and gross hematuria.  She denies constipation and recent dietary changes.  Additionally, she reports some dysuria, only in the mornings.  Last trimethoprim 2 days ago.  In-office UA and microscopy today pan negative.  PMH: Past Medical History:  Diagnosis Date  . Dysplasia of cervix, low grade (CIN 1) 02/15/2017   Will need repeat pap smear in 1 year.   Marland Kitchen History of Graves' disease   . Hyperlipemia   . Hypertension   . Marfan syndrome   . Ovarian cyst   . Vitamin D deficiency     Surgical History: Past Surgical History:  Procedure Laterality Date  . DILATION AND CURETTAGE OF UTERUS    . TONSILECTOMY/ADENOIDECTOMY WITH MYRINGOTOMY    . TUBAL LIGATION      Home Medications:  Allergies as of 04/01/2020      Reactions   Hydrocodone-acetaminophen Itching   Oxycodone-acetaminophen Other (See Comments), Itching      Medication List       Accurate as of Apr 01, 2020  6:34 PM. If you have any questions, ask your nurse or doctor.        STOP taking these medications   doxycycline 100 MG tablet Commonly known as: VIBRA-TABS Stopped by: Debroah Loop, PA-C   fesoterodine 4 MG Tb24 tablet Commonly known as: TOVIAZ Stopped by: Debroah Loop, PA-C   hydrOXYzine 25 MG tablet Commonly known as: ATARAX/VISTARIL Stopped by: Debroah Loop, PA-C     TAKE these medications   atorvastatin 40 MG tablet Commonly known as: LIPITOR Take by mouth.    buPROPion 300 MG 24 hr tablet Commonly known as: WELLBUTRIN XL Take by mouth.   busPIRone 10 MG tablet Commonly known as: BUSPAR Take 10 mg by mouth 2 (two) times daily.   conjugated estrogens vaginal cream Commonly known as: PREMARIN Place 1 Applicatorful vaginally daily.   Fish Oil 1000 MG Caps Take by mouth.   levothyroxine 50 MCG tablet Commonly known as: SYNTHROID Take 50 mcg by mouth daily before breakfast.   mirabegron ER 50 MG Tb24 tablet Commonly known as: MYRBETRIQ Take 1 tablet (50 mg total) by mouth daily.   pantoprazole 40 MG tablet Commonly known as: PROTONIX Take 40 mg by mouth daily.   phentermine 30 MG capsule Take 1 capsule (30 mg total) by mouth every morning.   PROBIOTIC DAILY PO Take by mouth.   traZODone 50 MG tablet Commonly known as: DESYREL Take 50 mg by mouth at bedtime. 100 mg at bedtime   trimethoprim 100 MG tablet Commonly known as: TRIMPEX Take 1 tablet (100 mg total) by mouth daily.   Vitamin D (Ergocalciferol) 1.25 MG (50000 UNIT) Caps capsule Commonly known as: DRISDOL Take 1 capsule (50,000 Units total) by mouth once a week.       Allergies:  Allergies  Allergen Reactions  . Hydrocodone-Acetaminophen Itching  . Oxycodone-Acetaminophen Other (See Comments) and Itching    Family History: Family History  Problem Relation Age of Onset  . Hyperlipidemia Mother   .  Hyperthyroidism Mother   . Thyroid cancer Father   . Marfan syndrome Father   . Marfan syndrome Sister   . Depression Sister   . Depression Brother   . Breast cancer Paternal Grandmother        great PGM    Social History:   reports that she quit smoking about 3 years ago. Her smoking use included cigarettes. She has a 5.00 pack-year smoking history. She has never used smokeless tobacco. She reports current alcohol use. She reports that she does not use drugs.  Physical Exam: BP 106/77   Pulse 93   Ht 5' 4"  (1.626 m)   Wt 173 lb 9.6 oz (78.7 kg)   BMI  29.80 kg/m   Constitutional:  Alert and oriented, no acute distress, nontoxic appearing HEENT: [place], AT Cardiovascular: No clubbing, cyanosis, or edema Respiratory: Normal respiratory effort, no increased work of breathing Skin: No rashes, bruises or suspicious lesions Neurologic: Grossly intact, no focal deficits, moving all 4 extremities Psychiatric: Normal mood and affect  Laboratory Data: Results for orders placed or performed in visit on 04/01/20  CULTURE, URINE COMPREHENSIVE   Specimen: Urine   UR  Result Value Ref Range   Urine Culture, Comprehensive Preliminary report    Organism ID, Bacteria Comment    Organism ID, Bacteria Comment   Microscopic Examination   URINE  Result Value Ref Range   WBC, UA 0-5 0 - 5 /hpf   RBC None seen 0 - 2 /hpf   Epithelial Cells (non renal) 0-10 0 - 10 /hpf   Bacteria, UA Few None seen/Few  Urinalysis, Complete  Result Value Ref Range   Specific Gravity, UA 1.020 1.005 - 1.030   pH, UA 6.0 5.0 - 7.5   Color, UA Yellow Yellow   Appearance Ur Clear Clear   Leukocytes,UA Negative Negative   Protein,UA Negative Negative/Trace   Glucose, UA Negative Negative   Ketones, UA Negative Negative   RBC, UA Negative Negative   Bilirubin, UA Negative Negative   Urobilinogen, Ur 0.2 0.2 - 1.0 mg/dL   Nitrite, UA Negative Negative   Microscopic Examination See below:    Assessment & Plan:   1. Urinary urgency UA today reassuring for infection, will send for culture for further evaluation.  Will contact patient with urine culture results. - Urinalysis, Complete - CULTURE, URINE COMPREHENSIVE  Return for Will call with culture results.  Debroah Loop, PA-C  [facility] [address] [place], [place] [address] [phone]

## 2020-04-02 LAB — URINALYSIS, COMPLETE
Bilirubin, UA: NEGATIVE
Glucose, UA: NEGATIVE
Ketones, UA: NEGATIVE
Leukocytes,UA: NEGATIVE
Nitrite, UA: NEGATIVE
Protein,UA: NEGATIVE
RBC, UA: NEGATIVE
Specific Gravity, UA: 1.02 (ref 1.005–1.030)
Urobilinogen, Ur: 0.2 mg/dL (ref 0.2–1.0)
pH, UA: 6 (ref 5.0–7.5)

## 2020-04-02 LAB — MICROSCOPIC EXAMINATION: RBC: NONE SEEN /hpf (ref 0–2)

## 2020-04-05 LAB — CULTURE, URINE COMPREHENSIVE

## 2020-04-08 ENCOUNTER — Telehealth: Payer: Self-pay | Admitting: Physician Assistant

## 2020-04-08 NOTE — Telephone Encounter (Signed)
Please contact the patient and inform her that her urine culture has resulted negative.  She does not have a UTI.  I suspect she is having a flare of her OAB symptoms.  I recommend avoiding bladder irritants, including caffeine, alcohol, carbonated beverages, chocolate, spicy foods, and acidic foods including citrus and tomatoes.

## 2020-04-08 NOTE — Telephone Encounter (Signed)
Called pt no answer. Left detailed message per DPR. Advised to call back for questions or concerns.

## 2020-04-11 DIAGNOSIS — H52223 Regular astigmatism, bilateral: Secondary | ICD-10-CM | POA: Diagnosis not present

## 2020-04-11 DIAGNOSIS — H5213 Myopia, bilateral: Secondary | ICD-10-CM | POA: Diagnosis not present

## 2020-04-17 ENCOUNTER — Encounter: Payer: Medicaid Other | Admitting: Obstetrics and Gynecology

## 2020-04-22 ENCOUNTER — Encounter: Payer: Medicaid Other | Admitting: Obstetrics and Gynecology

## 2020-04-25 ENCOUNTER — Encounter: Payer: Medicaid Other | Admitting: Obstetrics and Gynecology

## 2020-04-25 DIAGNOSIS — N390 Urinary tract infection, site not specified: Secondary | ICD-10-CM | POA: Diagnosis not present

## 2020-04-25 DIAGNOSIS — J309 Allergic rhinitis, unspecified: Secondary | ICD-10-CM | POA: Diagnosis not present

## 2020-04-25 DIAGNOSIS — M545 Low back pain: Secondary | ICD-10-CM | POA: Diagnosis not present

## 2020-04-25 DIAGNOSIS — E785 Hyperlipidemia, unspecified: Secondary | ICD-10-CM | POA: Diagnosis not present

## 2020-05-06 DIAGNOSIS — M545 Low back pain: Secondary | ICD-10-CM | POA: Diagnosis not present

## 2020-05-06 DIAGNOSIS — M25562 Pain in left knee: Secondary | ICD-10-CM | POA: Diagnosis not present

## 2020-05-06 DIAGNOSIS — M25561 Pain in right knee: Secondary | ICD-10-CM | POA: Diagnosis not present

## 2020-05-07 ENCOUNTER — Encounter: Payer: Self-pay | Admitting: Obstetrics and Gynecology

## 2020-05-07 ENCOUNTER — Ambulatory Visit (INDEPENDENT_AMBULATORY_CARE_PROVIDER_SITE_OTHER): Payer: Medicaid Other | Admitting: Obstetrics and Gynecology

## 2020-05-07 ENCOUNTER — Other Ambulatory Visit: Payer: Self-pay

## 2020-05-07 VITALS — BP 114/81 | HR 98 | Ht 64.0 in | Wt 172.0 lb

## 2020-05-07 DIAGNOSIS — Z87440 Personal history of urinary (tract) infections: Secondary | ICD-10-CM

## 2020-05-07 DIAGNOSIS — B9689 Other specified bacterial agents as the cause of diseases classified elsewhere: Secondary | ICD-10-CM

## 2020-05-07 DIAGNOSIS — N949 Unspecified condition associated with female genital organs and menstrual cycle: Secondary | ICD-10-CM

## 2020-05-07 DIAGNOSIS — N760 Acute vaginitis: Secondary | ICD-10-CM

## 2020-05-07 LAB — POCT URINALYSIS DIPSTICK
Bilirubin, UA: NEGATIVE
Blood, UA: NEGATIVE
Glucose, UA: NEGATIVE
Ketones, UA: NEGATIVE
Leukocytes, UA: NEGATIVE
Nitrite, UA: NEGATIVE
Protein, UA: NEGATIVE
Spec Grav, UA: 1.01 (ref 1.010–1.025)
Urobilinogen, UA: 0.2 E.U./dL
pH, UA: 7.5 (ref 5.0–8.0)

## 2020-05-07 NOTE — Patient Instructions (Signed)
Bacterial Vaginosis  Bacterial vaginosis is a vaginal infection that occurs when the normal balance of bacteria in the vagina is disrupted. It results from an overgrowth of certain bacteria. This is the most common vaginal infection among women ages 55-44. Because bacterial vaginosis increases your risk for STIs (sexually transmitted infections), getting treated can help reduce your risk for chlamydia, gonorrhea, herpes, and HIV (human immunodeficiency virus). Treatment is also important for preventing complications in pregnant women, because this condition can cause an early (premature) delivery. What are the causes? This condition is caused by an increase in harmful bacteria that are normally present in small amounts in the vagina. However, the reason that the condition develops is not fully understood. What increases the risk? The following factors may make you more likely to develop this condition:  Having a new sexual partner or multiple sexual partners.  Having unprotected sex.  Douching.  Having an intrauterine device (IUD).  Smoking.  Drug and alcohol abuse.  Taking certain antibiotic medicines.  Being pregnant. You cannot get bacterial vaginosis from toilet seats, bedding, swimming pools, or contact with objects around you. What are the signs or symptoms? Symptoms of this condition include:  Grey or white vaginal discharge. The discharge can also be watery or foamy.  A fish-like odor with discharge, especially after sexual intercourse or during menstruation.  Itching in and around the vagina.  Burning or pain with urination. Some women with bacterial vaginosis have no signs or symptoms. How is this diagnosed? This condition is diagnosed based on:  Your medical history.  A physical exam of the vagina.  Testing a sample of vaginal fluid under a microscope to look for a large amount of bad bacteria or abnormal cells. Your health care provider may use a cotton swab or  a small wooden spatula to collect the sample. How is this treated? This condition is treated with antibiotics. These may be given as a pill, a vaginal cream, or a medicine that is put into the vagina (suppository). If the condition comes back after treatment, a second round of antibiotics may be needed. Follow these instructions at home: Medicines  Take over-the-counter and prescription medicines only as told by your health care provider.  Take or use your antibiotic as told by your health care provider. Do not stop taking or using the antibiotic even if you start to feel better. General instructions  If you have a female sexual partner, tell her that you have a vaginal infection. She should see her health care provider and be treated if she has symptoms. If you have a female sexual partner, he does not need treatment.  During treatment: ? Avoid sexual activity until you finish treatment. ? Do not douche. ? Avoid alcohol as directed by your health care provider. ? Avoid breastfeeding as directed by your health care provider.  Drink enough water and fluids to keep your urine clear or pale yellow.  Keep the area around your vagina and rectum clean. ? Wash the area daily with warm water. ? Wipe yourself from front to back after using the toilet.  Keep all follow-up visits as told by your health care provider. This is important. How is this prevented?  Do not douche.  Wash the outside of your vagina with warm water only.  Use protection when having sex. This includes latex condoms and dental dams.  Limit how many sexual partners you have. To help prevent bacterial vaginosis, it is best to have sex with just one partner (  monogamous).  Make sure you and your sexual partner are tested for STIs.  Wear cotton or cotton-lined underwear.  Avoid wearing tight pants and pantyhose, especially during summer.  Limit the amount of alcohol that you drink.  Do not use any products that contain  nicotine or tobacco, such as cigarettes and e-cigarettes. If you need help quitting, ask your health care provider.  Do not use illegal drugs. Where to find more information  Centers for Disease Control and Prevention: AppraiserFraud.fi  American Sexual Health Association (ASHA): www.ashastd.org  [place] Department of Health and Financial controller, Office on Women's Health: DustingSprays.pl or SecuritiesCard.it Contact a health care provider if:  Your symptoms do not improve, even after treatment.  You have more discharge or pain when urinating.  You have a fever.  You have pain in your abdomen.  You have pain during sex.  You have vaginal bleeding between periods. Summary  Bacterial vaginosis is a vaginal infection that occurs when the normal balance of bacteria in the vagina is disrupted.  Because bacterial vaginosis increases your risk for STIs (sexually transmitted infections), getting treated can help reduce your risk for chlamydia, gonorrhea, herpes, and HIV (human immunodeficiency virus). Treatment is also important for preventing complications in pregnant women, because the condition can cause an early (premature) delivery.  This condition is treated with antibiotic medicines. These may be given as a pill, a vaginal cream, or a medicine that is put into the vagina (suppository). This information is not intended to replace advice given to you by your health care provider. Make sure you discuss any questions you have with your health care provider. Document Revised: 10/07/2017 Document Reviewed: 07/10/2016 Elsevier Patient Education  2020 [employer].

## 2020-05-07 NOTE — Progress Notes (Signed)
Pt present due to having vaginal burning and itching after taking antibiotics.  Pt stated that she has tried OTC vaginal cream it the area and it seems to not help it causing burning to increase. Area itching and burning is outside the vaginal area.

## 2020-05-07 NOTE — Progress Notes (Signed)
    GYNECOLOGY CLINIC PROGRESS NOTE Subjective:    Patient ID: Natalie [name], female    DOB: [birth year], 44 y.o.   MRN: [id number]  HPI  Patient is a 44 y.o. [id number] female who presents for complaints of itching and burning of the vulva area after a course antibiotics for suspected UTI. Tried Monistat 3, however still noting symptoms. Last treatment was yesterday.   She also notes vaginal dryness x 1 month. Is using Premarin cream externally for h/o recurrent UTI's, wonders if she should start using internally well.   The following portions of the patient's history were reviewed and updated as appropriate: allergies, current medications, past family history, past medical history, past social history, past surgical history and problem list.  Review of Systems Pertinent items noted in HPI and remainder of comprehensive ROS otherwise negative.   Objective:   Blood pressure 114/81, pulse 98, height 5' 4"  (1.626 m), weight 172 lb (78 kg). General appearance: alert and no distress Abdomen: soft, non-tender; bowel sounds normal; no masses,  no organomegaly Pelvic: external genitalia normal, rectovaginal septum normal.  Vagina with moderate white discharge, no odor.  Bimanual exam not performed.     Labs:   Microscopic wet-mount exam shows moderate clue cells, few hyphae, no trichomonads, no white blood cells. KOH done.    Results for orders placed or performed in visit on 05/07/20  POCT urinalysis dipstick  Result Value Ref Range   Color, UA yellow    Clarity, UA clear    Glucose, UA Negative Negative   Bilirubin, UA neg    Ketones, UA neg    Spec Grav, UA 1.010 1.010 - 1.025   Blood, UA neg    pH, UA 7.5 5.0 - 8.0   Protein, UA Negative Negative   Urobilinogen, UA 0.2 0.2 or 1.0 E.U./dL   Nitrite, UA neg    Leukocytes, UA Negative Negative   Appearance yellow;clear    Odor       Assessment:   Bacterial vaginosis  Vaginal dryness History of recurrent UTI's  Plan:    - Will give treatment of Flagyl for BV infection. Also will prescribe dose of Diflucan for prevention of yeast infection after antibiotic use.  - Vaginal dryness, advised that she could use Premarin cream internally. 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  - To follow up as needed. Patient notes she has an appointment next month to f/u on weight loss, however did not pick up prescription for Phentermine this month due to cost.    Rubie Maid, MD [facility]

## 2020-05-08 DIAGNOSIS — Z419 Encounter for procedure for purposes other than remedying health state, unspecified: Secondary | ICD-10-CM | POA: Diagnosis not present

## 2020-05-09 ENCOUNTER — Other Ambulatory Visit: Payer: Self-pay

## 2020-05-09 MED ORDER — FLUCONAZOLE 150 MG PO TABS
150.0000 mg | ORAL_TABLET | Freq: Once | ORAL | 0 refills | Status: AC
Start: 2020-05-09 — End: 2020-05-09

## 2020-05-09 MED ORDER — METRONIDAZOLE 500 MG PO TABS
500.0000 mg | ORAL_TABLET | Freq: Two times a day (BID) | ORAL | 0 refills | Status: DC
Start: 2020-05-09 — End: 2020-07-21

## 2020-05-29 DIAGNOSIS — R109 Unspecified abdominal pain: Secondary | ICD-10-CM | POA: Diagnosis not present

## 2020-05-29 DIAGNOSIS — E785 Hyperlipidemia, unspecified: Secondary | ICD-10-CM | POA: Diagnosis not present

## 2020-05-29 DIAGNOSIS — E7800 Pure hypercholesterolemia, unspecified: Secondary | ICD-10-CM | POA: Diagnosis not present

## 2020-05-29 DIAGNOSIS — M545 Low back pain: Secondary | ICD-10-CM | POA: Diagnosis not present

## 2020-05-29 DIAGNOSIS — J309 Allergic rhinitis, unspecified: Secondary | ICD-10-CM | POA: Diagnosis not present

## 2020-05-29 DIAGNOSIS — G43909 Migraine, unspecified, not intractable, without status migrainosus: Secondary | ICD-10-CM | POA: Diagnosis not present

## 2020-06-04 ENCOUNTER — Encounter: Payer: Self-pay | Admitting: Obstetrics and Gynecology

## 2020-06-04 ENCOUNTER — Ambulatory Visit (INDEPENDENT_AMBULATORY_CARE_PROVIDER_SITE_OTHER): Payer: Medicaid Other | Admitting: Obstetrics and Gynecology

## 2020-06-04 VITALS — BP 117/82 | HR 83 | Ht 64.0 in | Wt 171.0 lb

## 2020-06-04 DIAGNOSIS — L292 Pruritus vulvae: Secondary | ICD-10-CM

## 2020-06-04 DIAGNOSIS — Z87440 Personal history of urinary (tract) infections: Secondary | ICD-10-CM

## 2020-06-04 DIAGNOSIS — Z1231 Encounter for screening mammogram for malignant neoplasm of breast: Secondary | ICD-10-CM | POA: Diagnosis not present

## 2020-06-04 DIAGNOSIS — N952 Postmenopausal atrophic vaginitis: Secondary | ICD-10-CM

## 2020-06-04 DIAGNOSIS — Z01419 Encounter for gynecological examination (general) (routine) without abnormal findings: Secondary | ICD-10-CM | POA: Diagnosis not present

## 2020-06-04 DIAGNOSIS — E663 Overweight: Secondary | ICD-10-CM

## 2020-06-04 MED ORDER — PHENTERMINE HCL 30 MG PO CAPS: 30.0000 mg | ORAL_CAPSULE | ORAL | 0 refills | Status: DC

## 2020-06-04 MED ORDER — FLUCONAZOLE 150 MG PO TABS
150.0000 mg | ORAL_TABLET | Freq: Once | ORAL | 3 refills | Status: AC
Start: 2020-06-04 — End: 2020-06-04

## 2020-06-04 NOTE — Progress Notes (Signed)
Pt present for annual exam. Pt stated that she was doing well other than noticing some itching in the vaginal area after starting antibiotics.

## 2020-06-04 NOTE — Progress Notes (Signed)
GYNECOLOGY ANNUAL PHYSICAL EXAM PROGRESS NOTE  Subjective:    Natalie [name] is a 43 y.o. postmenopausal [phone] female who presents for an annual exam.  The patient is sexually active. The patient has been on hormone replacement therapy in the past. The patient wears seatbelts: yes. The patient participates in regular exercise: yes, walks on the treadmill and lifts dumbbells. Has the patient ever been transfused or tattooed?: no. The patient reports that there is not domestic violence in her life.   The patient has the following complaints today: 1.  The patient notes some vaginal itching that arises approximately once a week. She denies noticing anything that worsens or improves the itching. Patient does not appear to be bothered by it at this time but just wanted to mention it. Is using Premarin cream as prescribed. She was placed on antibiotics for bronchitis and feels it may be associated.  2. Patient reports that she feels like she is gaining weight. Has not been able to utilize weight loss medication previously prescribed (Phentermine) due to cost.  She states that her insurance recently changed in July.    Gynecologic History Menarche age: 59 No LMP recorded. Patient is menopausal. Contraception: post menopausal status History of STI's: H/o HPV Last Pap: 04/17/2019. Results were: normal  Reports remote h/o mildly abnormal pap smear in the past. Last mammogram: 04/18/2019. Results were: normal.     OB History  Gravida Para Term Preterm AB Living  5 3 2 1 2 2   SAB TAB Ectopic Multiple Live Births  2 0 0 0 3    # Outcome Date GA Lbr Len/2nd Weight Sex Delivery Anes PTL Lv  5 Term         LIV  4 SAB           3 SAB           2 Term         ND  1 Preterm     F Vag-Spont   LIV    Past Medical History:  Diagnosis Date  . Bacterial vaginosis   . Dysplasia of cervix, low grade (CIN 1) 02/15/2017   Will need repeat pap smear in 1 year.   Marland Kitchen History of Graves' disease   .  Hyperlipemia   . Hypertension   . Marfan syndrome   . Ovarian cyst   . Vitamin D deficiency     Past Surgical History:  Procedure Laterality Date  . DILATION AND CURETTAGE OF UTERUS    . TONSILECTOMY/ADENOIDECTOMY WITH MYRINGOTOMY    . TUBAL LIGATION      Family History  Problem Relation Age of Onset  . Hyperlipidemia Mother   . Hyperthyroidism Mother   . Thyroid cancer Father   . Marfan syndrome Father   . Marfan syndrome Sister   . Depression Sister   . Depression Brother   . Breast cancer Paternal Grandmother        great PGM    Social History   Socioeconomic History  . Marital status: Married    Spouse name: Not on file  . Number of children: Not on file  . Years of education: Not on file  . Highest education level: Not on file  Occupational History  . Not on file  Tobacco Use  . Smoking status: Former Smoker    Packs/day: 0.50    Years: 10.00    Pack years: 5.00    Types: Cigarettes    Quit date: 12/12/2016  Years since quitting: 3.4  . Smokeless tobacco: Never Used  Vaping Use  . Vaping Use: Never used  Substance and Sexual Activity  . Alcohol use: Yes    Alcohol/week: 0.0 standard drinks  . Drug use: No  . Sexual activity: Yes    Birth control/protection: None  Other Topics Concern  . Not on file  Social History Narrative  . Not on file   Social Determinants of Health   Financial Resource Strain:   . Difficulty of Paying Living Expenses:   Food Insecurity:   . Worried About Charity fundraiser in the Last Year:   . Arboriculturist in the Last Year:   Transportation Needs:   . Film/video editor (Medical):   Marland Kitchen Lack of Transportation (Non-Medical):   Physical Activity:   . Days of Exercise per Week:   . Minutes of Exercise per Session:   Stress:   . Feeling of Stress :   Social Connections:   . Frequency of Communication with Friends and Family:   . Frequency of Social Gatherings with Friends and Family:   . Attends Religious  Services:   . Active Member of Clubs or Organizations:   . Attends Archivist Meetings:   Marland Kitchen Marital Status:   Intimate Partner Violence:   . Fear of Current or Ex-Partner:   . Emotionally Abused:   Marland Kitchen Physically Abused:   . Sexually Abused:     Current Outpatient Medications on File Prior to Visit  Medication Sig Dispense Refill  . atorvastatin (LIPITOR) 40 MG tablet Take by mouth.    Marland Kitchen buPROPion (WELLBUTRIN XL) 300 MG 24 hr tablet Take by mouth.    . busPIRone (BUSPAR) 10 MG tablet Take 10 mg by mouth 2 (two) times daily.    Marland Kitchen conjugated estrogens (PREMARIN) vaginal cream Place 1 Applicatorful vaginally daily. 42.5 g 12  . levothyroxine (SYNTHROID) 50 MCG tablet Take 50 mcg by mouth daily before breakfast.    . metroNIDAZOLE (FLAGYL) 500 MG tablet Take 1 tablet (500 mg total) by mouth 2 (two) times daily. 14 tablet 0  . mirabegron ER (MYRBETRIQ) 50 MG TB24 tablet Take 1 tablet (50 mg total) by mouth daily. 90 tablet 3  . pantoprazole (PROTONIX) 40 MG tablet Take 40 mg by mouth daily.  5  . Probiotic Product (PROBIOTIC DAILY PO) Take by mouth.    . traZODone (DESYREL) 100 MG tablet Take 100 mg by mouth at bedtime.    Marland Kitchen trimethoprim (TRIMPEX) 100 MG tablet Take 1 tablet (100 mg total) by mouth daily. 90 tablet 3  . Vitamin D, Ergocalciferol, (DRISDOL) 50000 UNITS CAPS capsule Take 1 capsule (50,000 Units total) by mouth once a week. 12 capsule 0  . Omega-3 Fatty Acids (FISH OIL) 1000 MG CAPS Take by mouth. (Patient not taking: Reported on 06/04/2020)    . phentermine 30 MG capsule Take 1 capsule (30 mg total) by mouth every morning. (Patient not taking: Reported on 05/07/2020) 45 capsule 0  . traZODone (DESYREL) 50 MG tablet Take 50 mg by mouth at bedtime. 100 mg at bedtime (Patient not taking: Reported on 06/04/2020)     No current facility-administered medications on file prior to visit.    Allergies  Allergen Reactions  . Hydrocodone-Acetaminophen Itching  .  Oxycodone-Acetaminophen Other (See Comments) and Itching     Review of Systems Constitutional: negative for chills, fatigue, fevers and sweats.   Eyes: negative for irritation, redness and visual disturbance Ears, nose,  mouth, throat, and face: negative for hearing loss, nasal congestion, snoring and tinnitus Respiratory: negative for asthma, cough, sputum Cardiovascular: negative for chest pain, dyspnea, exertional chest pressure/discomfort, irregular heart beat, palpitations and syncope Gastrointestinal: negative for abdominal pain, change in bowel habits, nausea and vomiting Genitourinary: positive for intermittent vulvar itching.  Negative for genital lesions, sexual problems and vaginal discharge, dysuria and urinary incontinence Integument/breast: negative for breast lump, breast tenderness and nipple discharge Hematologic/lymphatic: negative for bleeding and easy bruising Musculoskeletal:negative for back pain and muscle weakness Neurological: negative for dizziness, headaches, vertigo and weakness Endocrine: negative for diabetic symptoms including polydipsia, polyuria and skin dryness Allergic/Immunologic: negative for hay fever and urticaria      Objective:  Blood pressure 117/82, pulse 83, height 5' 4"  (1.626 m), weight 77.6 kg. Body mass index is 29.35 kg/m.  General Appearance:    Alert, cooperative, no distress, appears stated age  Head:    Normocephalic, without obvious abnormality, atraumatic  Eyes:    PERRL, conjunctiva/corneas clear, EOM's intact, both eyes  Ears:    Normal external ear canals, both ears  Nose:   Nares normal, septum midline, mucosa normal, no drainage or sinus tenderness  Throat:   Lips, mucosa, and tongue normal; teeth and gums normal  Neck:   Supple, symmetrical, trachea midline, no adenopathy; thyroid: no enlargement/tenderness/nodules; no carotid bruit or JVD  Back:     Symmetric, no curvature, ROM normal, no CVA tenderness  Lungs:     MIld  rhonchi noted at base of the lungs. Respirations unlabored  Chest Wall:    No tenderness or deformity   Heart:    Regular rate and rhythm, S1 and S2 normal, no murmur, rub or gallop  Breast Exam:    No tenderness, masses, or nipple abnormality  Abdomen:     Soft, non-tender, bowel sounds active all four quadrants, no masses, no organomegaly.    Genitalia:    Pelvic:external genitalia normal, vagina without lesions, scant thin white discharge, no odor. Mild vaginal/vulvar atrophy present. Rectovaginal septum  normal. Cervix normal in appearance, no cervical motion tenderness, no adnexal masses or tenderness.  Uterus normal size, shape, mobile, regular contours, nontender.  Rectal:    Normal external sphincter.  No hemorrhoids appreciated. Internal exam not done.   Extremities:   Extremities normal, atraumatic, no cyanosis or edema  Pulses:   2+ and symmetric all extremities  Skin:   Skin color, texture, turgor normal, no rashes or lesions  Lymph nodes:   Cervical, supraclavicular, and axillary nodes normal  Neurologic:   CNII-XII intact, normal strength, sensation and reflexes throughout   .  Labs:  No results found for: WBC, HGB, HCT, MCV, PLT  No results found for: CREATININE, BUN, NA, K, CL, CO2  No results found for: ALT, AST, GGT, ALKPHOS, BILITOT  No results found for: TSH   Assessment:   1. Encounter for well woman exam with routine gynecological exam   2. Breast cancer screening by mammogram   3. History of recurrent UTIs   4. Vaginal atrophy   5. Vulvar itching   6. Overweight (BMI 25.0-29.9)     Plan:    - Blood tests: Patient plans to have labs with PCP.   - Breast self exam technique reviewed and patient encouraged to perform self-exam monthly. - Contraception: patient is postmenopausal.  - Discussed healthy lifestyle modifications. - Discussed weight loss management. Has not been able to continue Phentermine due to cost. Review of chart notes weight has not  changed in the past 2 months despite not using the medication, and has remained the same (she has maintained).  Advised that since patient's insurance recently changed, can try to prescribe again for coverage. If covered, patient will need to f/u in 1 month for weight reassessment.  - Mammogram due. Order was placed. - Pap smear up to date.  - Continue on Premarin cream for vulvo-vaginal atrophy and h/o recurrent UTI's.  - Vaginal atrophy noted on exam today. No infection noted today, however patient recently placed on antibiotics for management of bronchitis. Discussed possibility of development of a yeast infection after antibiotic use. Will give prescription for Diflucan for just in case.  - RTC in 1 year, or sooner for any gynecologic concerns.     I have seen and examined the patient with Doristine Locks, [place] PA-S.  I have reviewed the record and concur with patient management and plan as documented.    Rubie Maid, MD [facility]

## 2020-06-08 DIAGNOSIS — Z419 Encounter for procedure for purposes other than remedying health state, unspecified: Secondary | ICD-10-CM | POA: Diagnosis not present

## 2020-06-12 NOTE — Telephone Encounter (Signed)
Patient called in stating she hasn't been called yet regarding her pap smear results. Could you please advise?   Thank you.

## 2020-06-13 DIAGNOSIS — K122 Cellulitis and abscess of mouth: Secondary | ICD-10-CM | POA: Diagnosis not present

## 2020-06-13 DIAGNOSIS — F419 Anxiety disorder, unspecified: Secondary | ICD-10-CM | POA: Diagnosis not present

## 2020-06-13 DIAGNOSIS — M545 Low back pain: Secondary | ICD-10-CM | POA: Diagnosis not present

## 2020-06-13 DIAGNOSIS — R5383 Other fatigue: Secondary | ICD-10-CM | POA: Diagnosis not present

## 2020-06-20 ENCOUNTER — Other Ambulatory Visit: Payer: Self-pay

## 2020-06-20 MED ORDER — MIRABEGRON ER 50 MG PO TB24: 50.0000 mg | ORAL_TABLET | Freq: Every day | ORAL | 3 refills | Status: DC

## 2020-06-23 ENCOUNTER — Telehealth: Payer: Self-pay

## 2020-06-23 NOTE — Telephone Encounter (Signed)
Incoming call on triage line from patient with complaints of waking up during the night having to urinate since starting a daily antibiotic. Patient states that when she was taking Myrbetriq only, she was not waking up during the night. Patient would like to know if this could be related to the ABX or not. Please advise.

## 2020-06-26 NOTE — Telephone Encounter (Signed)
Not related to antibiotic

## 2020-06-26 NOTE — Telephone Encounter (Signed)
Notified patient this is not related to the antibiotic. Patient states this is an intermittent problem that is not bothersome at this time. States she will call back if needs to be seen sooner than scheduled appointment.

## 2020-06-30 ENCOUNTER — Encounter: Payer: Self-pay | Admitting: Urology

## 2020-06-30 ENCOUNTER — Other Ambulatory Visit: Payer: Self-pay

## 2020-06-30 ENCOUNTER — Ambulatory Visit (INDEPENDENT_AMBULATORY_CARE_PROVIDER_SITE_OTHER): Payer: Medicaid Other | Admitting: Urology

## 2020-06-30 VITALS — BP 118/87 | HR 89

## 2020-06-30 DIAGNOSIS — R3915 Urgency of urination: Secondary | ICD-10-CM

## 2020-06-30 DIAGNOSIS — N3946 Mixed incontinence: Secondary | ICD-10-CM

## 2020-06-30 MED ORDER — FESOTERODINE FUMARATE ER 8 MG PO TB24
8.0000 mg | ORAL_TABLET | Freq: Every day | ORAL | 11 refills | Status: DC
Start: 2020-06-30 — End: 2021-06-29

## 2020-06-30 MED ORDER — TRIMETHOPRIM 100 MG PO TABS
100.0000 mg | ORAL_TABLET | Freq: Every day | ORAL | 3 refills | Status: DC
Start: 2020-06-30 — End: 2020-08-14

## 2020-06-30 NOTE — Progress Notes (Signed)
06/30/2020 10:10 AM   Natalie [name] 02-21-1976 [id number]  Referring provider: Lorelee [name], [place],  Natalie [name] [address]  Chief Complaint  Patient presents with  . Urinary Frequency    HPI: I was consulted to assess the patient for possible overactive bladder or urinary tract infections. She thinks he gets 8 infections a year. She will get a strong urge and then she sit and cannot urinate. She will get a tingling feeling in the vaginal area. The symptoms seem to be more from a possible infection and they usually temporarily respond to an antibiotic.  I think she still [name] some of the symptoms when she is not infected. She voids every 1-2 hours gets up once at night. She is continent. She is on Toviaz which is helping some. She quit smoking 3 years ago.It appears that she tried Vesicare and oxybutynin. She still [name] some urgency but overall the Natalie [name] helped  Pathophysiology of chronic cystitis discussed. Probiotics and suppression therapy discussed. Reassess in 8 weeks on trimethoprim 100 mg with 30x11. Stay on [place]. If she does down regulate consider stopping the Toviaz.  Clinically infection free.  Overactive bladder much improved.  Renew Myrbetriq and trimethoprim 90x3 and recheck in 6 months  Today Frequency stable.  In May she called in with frequency and urgency but culture was negative Clinically I think the patient is infection free on daily trimethoprim.  She still feels a little bit of pressure in the middle of the night with a tingle.  [name] had the symptoms before with or without infection.  Call if urine culture positive She had a change in her insurance and they are not covering the Myrbetriq.  [name] failed Vesicare and oxybutynin and was a partial responder to [employer] She is continent.  I think she tends to be more symptomatic at night.   PMH: Past Medical History:  Diagnosis Date  . Bacterial vaginosis   . Dysplasia  of cervix, low grade (CIN 1) 02/15/2017   Will need repeat pap smear in 1 year.   Marland Kitchen History of Graves' disease   . Hyperlipemia   . Hypertension   . Marfan syndrome   . Ovarian cyst   . Vitamin D deficiency     Surgical History: Past Surgical History:  Procedure Laterality Date  . DILATION AND CURETTAGE OF UTERUS    . TONSILECTOMY/ADENOIDECTOMY WITH MYRINGOTOMY    . TUBAL LIGATION      Home Medications:  Allergies as of 06/30/2020      Reactions   Hydrocodone-acetaminophen Itching   Oxycodone-acetaminophen Other (See Comments), Itching      Medication List       Accurate as of June 30, 2020 10:10 AM. If you have any questions, ask your nurse or doctor.        STOP taking these medications   Fish Oil 1000 MG Caps Stopped by: Natalie Packer, MD     TAKE these medications   atorvastatin 40 MG tablet Commonly known as: LIPITOR Take by mouth.   buPROPion 300 MG 24 hr tablet Commonly known as: WELLBUTRIN XL Take by mouth.   busPIRone 10 MG tablet Commonly known as: BUSPAR Take 10 mg by mouth 2 (two) times daily.   conjugated estrogens vaginal cream Commonly known as: PREMARIN Place 1 Applicatorful vaginally daily.   levothyroxine 50 MCG tablet Commonly known as: SYNTHROID Take 50 mcg by mouth daily before breakfast.   metroNIDAZOLE 500 MG tablet Commonly known  as: FLAGYL Take 1 tablet (500 mg total) by mouth 2 (two) times daily.   mirabegron ER 50 MG Tb24 tablet Commonly known as: MYRBETRIQ Take 1 tablet (50 mg total) by mouth daily.   pantoprazole 40 MG tablet Commonly known as: PROTONIX Take 40 mg by mouth daily.   phentermine 30 MG capsule Take 1 capsule (30 mg total) by mouth every morning.   PROBIOTIC DAILY PO Take by mouth.   traZODone 100 MG tablet Commonly known as: DESYREL Take 100 mg by mouth at bedtime. What changed: Another medication with the same name was removed. Continue taking this medication, and follow the directions you  see here. Changed by: Natalie Packer, MD   trimethoprim 100 MG tablet Commonly known as: TRIMPEX Take 1 tablet (100 mg total) by mouth daily.   Vitamin D (Ergocalciferol) 1.25 MG (50000 UNIT) Caps capsule Commonly known as: DRISDOL Take 1 capsule (50,000 Units total) by mouth once a week.       Allergies:  Allergies  Allergen Reactions  . Hydrocodone-Acetaminophen Itching  . Oxycodone-Acetaminophen Other (See Comments) and Itching    Family History: Family History  Problem Relation Age of Onset  . Hyperlipidemia Mother   . Hyperthyroidism Mother   . Thyroid cancer Father   . Marfan syndrome Father   . Marfan syndrome Sister   . Depression Sister   . Depression Brother   . Breast cancer Paternal Grandmother        great PGM    Social History:  reports that she quit smoking about 3 years ago. Her smoking use included cigarettes. [name] a 5.00 pack-year smoking history. [name] never used smokeless tobacco. She reports current alcohol use. She reports that she does not use drugs.  ROS:                                        Physical Exam: BP 118/87   Pulse 89   LMP  (LMP Unknown)   Constitutional:  Alert and oriented, No acute distress.  Laboratory Data: No results found for: WBC, HGB, HCT, MCV, PLT  No results found for: CREATININE  No results found for: PSA  No results found for: TESTOSTERONE  No results found for: HGBA1C  Urinalysis    Component Value Date/Time   COLORURINE Yellow 10/09/2013 0859   APPEARANCEUR Clear 04/01/2020 1447   LABSPEC 1.006 10/09/2013 0859   PHURINE 6.0 10/09/2013 0859   GLUCOSEU Negative 04/01/2020 1447   GLUCOSEU Negative 10/09/2013 0859   HGBUR Negative 10/09/2013 0859   BILIRUBINUR neg 05/07/2020 1122   BILIRUBINUR Negative 04/01/2020 [place] Negative 10/09/2013 0859   KETONESUR Negative 10/09/2013 0859   PROTEINUR Negative 05/07/2020 1122   PROTEINUR Negative 04/01/2020 1447     PROTEINUR Negative 10/09/2013 0859   UROBILINOGEN 0.2 05/07/2020 1122   NITRITE neg 05/07/2020 1122   NITRITE Negative 04/01/2020 1447   NITRITE Negative 10/09/2013 0859   LEUKOCYTESUR Negative 05/07/2020 1122   LEUKOCYTESUR Negative 04/01/2020 1447   LEUKOCYTESUR Negative 10/09/2013 0859    Pertinent Imaging:   Assessment & Plan: Call if urine culture positive.  Trimethoprim renewed.  Called and Natalie Ply to try again.  See in a year.  I do not think there is another good option for her at this stage based upon symptom severity.  1. Urinary urgency  - Urinalysis, Complete   No follow-ups on  file.  Natalie Packer, MD  [facility] [address], [place], [place] [address] [phone]

## 2020-07-01 DIAGNOSIS — R5383 Other fatigue: Secondary | ICD-10-CM | POA: Diagnosis not present

## 2020-07-01 DIAGNOSIS — E785 Hyperlipidemia, unspecified: Secondary | ICD-10-CM | POA: Diagnosis not present

## 2020-07-01 DIAGNOSIS — F419 Anxiety disorder, unspecified: Secondary | ICD-10-CM | POA: Diagnosis not present

## 2020-07-01 DIAGNOSIS — R42 Dizziness and giddiness: Secondary | ICD-10-CM | POA: Diagnosis not present

## 2020-07-01 LAB — URINALYSIS, COMPLETE
Bilirubin, UA: NEGATIVE
Glucose, UA: NEGATIVE
Ketones, UA: NEGATIVE
Nitrite, UA: NEGATIVE
Protein,UA: NEGATIVE
RBC, UA: NEGATIVE
Specific Gravity, UA: 1.01 (ref 1.005–1.030)
Urobilinogen, Ur: 0.2 mg/dL (ref 0.2–1.0)
pH, UA: 6 (ref 5.0–7.5)

## 2020-07-01 LAB — MICROSCOPIC EXAMINATION

## 2020-07-04 LAB — CULTURE, URINE COMPREHENSIVE

## 2020-07-05 DIAGNOSIS — N390 Urinary tract infection, site not specified: Secondary | ICD-10-CM | POA: Diagnosis not present

## 2020-07-07 ENCOUNTER — Telehealth: Payer: Self-pay | Admitting: Family Medicine

## 2020-07-07 DIAGNOSIS — M545 Low back pain: Secondary | ICD-10-CM | POA: Diagnosis not present

## 2020-07-07 DIAGNOSIS — E785 Hyperlipidemia, unspecified: Secondary | ICD-10-CM | POA: Diagnosis not present

## 2020-07-07 DIAGNOSIS — G43909 Migraine, unspecified, not intractable, without status migrainosus: Secondary | ICD-10-CM | POA: Diagnosis not present

## 2020-07-07 DIAGNOSIS — R5383 Other fatigue: Secondary | ICD-10-CM | POA: Diagnosis not present

## 2020-07-07 NOTE — Telephone Encounter (Signed)
Patient called the after hours line on 07/05/2020 with complaints pressure, burning and feeling of incomplete bladder emptying. Left message on machine for patient to return call.

## 2020-07-09 DIAGNOSIS — Z419 Encounter for procedure for purposes other than remedying health state, unspecified: Secondary | ICD-10-CM | POA: Diagnosis not present

## 2020-07-18 ENCOUNTER — Ambulatory Visit
Admission: RE | Admit: 2020-07-18 | Discharge: 2020-07-18 | Disposition: A | Discharge status: Home / Self Care | Payer: Medicaid Other | Source: Ambulatory Visit | Attending: Obstetrics and Gynecology | Admitting: Obstetrics and Gynecology

## 2020-07-18 DIAGNOSIS — Z1231 Encounter for screening mammogram for malignant neoplasm of breast: Secondary | ICD-10-CM | POA: Insufficient documentation

## 2020-07-21 ENCOUNTER — Encounter: Payer: Self-pay | Admitting: Urology

## 2020-07-21 ENCOUNTER — Ambulatory Visit (INDEPENDENT_AMBULATORY_CARE_PROVIDER_SITE_OTHER): Payer: Medicaid Other | Admitting: Urology

## 2020-07-21 ENCOUNTER — Other Ambulatory Visit: Payer: Self-pay

## 2020-07-21 VITALS — BP 133/84 | HR 81

## 2020-07-21 DIAGNOSIS — N390 Urinary tract infection, site not specified: Secondary | ICD-10-CM

## 2020-07-21 DIAGNOSIS — N3020 Other chronic cystitis without hematuria: Secondary | ICD-10-CM

## 2020-07-21 MED ORDER — NITROFURANTOIN MONOHYD MACRO 100 MG PO CAPS: 100.0000 mg | ORAL_CAPSULE | Freq: Every day | ORAL | 11 refills | Status: DC

## 2020-07-21 NOTE — Progress Notes (Signed)
07/21/2020 9:25 AM   [name] 02/13/1976 [id number]  Referring provider: Lorelee Market, [place],  [place] [address]  Chief Complaint  Patient presents with  . Recurrent UTI    HPI: I was consulted to assess the patient for possible overactive bladder or urinary tract infections. She thinks he gets 8 infections a year. She will get a strong urge and then she sit and cannot urinate. She will get a tingling feeling in the vaginal area. The symptoms seem to be more from a possible infection and they usually temporarily respond to an antibiotic.  I think she still has some of the symptoms when she is not infected. She voids every 1-2 hours gets up once at night. She is continent. She is on Toviaz which is helping some. She quit smoking 3 years ago.It appears that she tried Vesicare and oxybutynin. She still has some urgency but overall the Lisbeth Ply has helped  Pathophysiology of chronic cystitis discussed. Probiotics and suppression therapy discussed. Reassess in 8 weeks on trimethoprim 100 mg with 30x11. Stay on [place]. If she does down regulate consider stopping the Toviaz.  Clinically infection free. Overactive bladder much improved. Renew Myrbetriq and trimethoprim 90x3 and recheck in 6 months   In May she called in with frequency and urgency but culture was negative Clinically I think the patient is infection free on daily trimethoprim.  She still feels a little bit of pressure in the middle of the night with a tingle.  She has had the symptoms before with or without infection.   She had a change in her insurance and they are not covering the Myrbetriq.  She has failed Vesicare and oxybutynin and was a partial responder to [employer]. She is continent.  I think she tends to be more symptomatic at night.  Call if urine culture positive.  Trimethoprim renewed.  Called and Lisbeth Ply to try again.  See in a year.  I do not think there is another good  option for her at this stage based upon symptom severity.  Today Frequency stable.  Last 2 urine cultures negative Approximately 2 weeks ago patient had burning and increased small-volume frequency.  Symptoms improving and was treated with Macrobid.  Still having some intermittent burning.  Still having some tingling.  Quit smoking 4 years ago.   PMH: Past Medical History:  Diagnosis Date  . Bacterial vaginosis   . Dysplasia of cervix, low grade (CIN 1) 02/15/2017   Will need repeat pap smear in 1 year.   Marland Kitchen History of Graves' disease   . Hyperlipemia   . Hypertension   . Marfan syndrome   . Ovarian cyst   . Vitamin D deficiency     Surgical History: Past Surgical History:  Procedure Laterality Date  . DILATION AND CURETTAGE OF UTERUS    . TONSILECTOMY/ADENOIDECTOMY WITH MYRINGOTOMY    . TUBAL LIGATION      Home Medications:  Allergies as of 07/21/2020      Reactions   Hydrocodone-acetaminophen Itching   Oxycodone-acetaminophen Other (See Comments), Itching      Medication List       Accurate as of July 21, 2020  9:25 AM. If you have any questions, ask your nurse or doctor.        STOP taking these medications   buPROPion 300 MG 24 hr tablet Commonly known as: WELLBUTRIN XL Stopped by: Reece Packer, MD   busPIRone 10 MG tablet Commonly known as: BUSPAR Stopped by:  Reece Packer, MD   metroNIDAZOLE 500 MG tablet Commonly known as: FLAGYL Stopped by: Reece Packer, MD   mirabegron ER 50 MG Tb24 tablet Commonly known as: MYRBETRIQ Stopped by: Reece Packer, MD     TAKE these medications   atorvastatin 40 MG tablet Commonly known as: LIPITOR Take by mouth.   conjugated estrogens vaginal cream Commonly known as: PREMARIN Place 1 Applicatorful vaginally daily.   cyclobenzaprine 10 MG tablet Commonly known as: FLEXERIL Take 10 mg by mouth 3 (three) times daily as needed.   DULoxetine 60 MG capsule Commonly known as:  CYMBALTA Take 60 mg by mouth daily.   DULoxetine 30 MG capsule Commonly known as: CYMBALTA Take 30 mg by mouth daily.   fesoterodine 8 MG Tb24 tablet Commonly known as: TOVIAZ Take 1 tablet (8 mg total) by mouth daily.   gabapentin 300 MG capsule Commonly known as: NEURONTIN Take 300 mg by mouth 2 (two) times daily.   levothyroxine 50 MCG tablet Commonly known as: SYNTHROID Take 50 mcg by mouth daily before breakfast.   pantoprazole 40 MG tablet Commonly known as: PROTONIX Take 40 mg by mouth daily.   phentermine 30 MG capsule Take 1 capsule (30 mg total) by mouth every morning.   PROBIOTIC DAILY PO Take by mouth.   traZODone 100 MG tablet Commonly known as: DESYREL Take 100 mg by mouth at bedtime.   triamterene-hydrochlorothiazide 37.5-25 MG tablet Commonly known as: MAXZIDE-25 Take 1 tablet by mouth daily.   trimethoprim 100 MG tablet Commonly known as: TRIMPEX Take 1 tablet (100 mg total) by mouth daily.   Vitamin D (Ergocalciferol) 1.25 MG (50000 UNIT) Caps capsule Commonly known as: DRISDOL Take 1 capsule (50,000 Units total) by mouth once a week.   Vitamin D3 1.25 MG (50000 UT) Caps Take 1 capsule by mouth once a week.       Allergies:  Allergies  Allergen Reactions  . Hydrocodone-Acetaminophen Itching  . Oxycodone-Acetaminophen Other (See Comments) and Itching    Family History: Family History  Problem Relation Age of Onset  . Hyperlipidemia Mother   . Hyperthyroidism Mother   . Thyroid cancer Father   . Marfan syndrome Father   . Marfan syndrome Sister   . Depression Sister   . Depression Brother   . Breast cancer Paternal Grandmother        great PGM    Social History:  reports that she quit smoking about 3 years ago. Her smoking use included cigarettes. She has a 5.00 pack-year smoking history. She has never used smokeless tobacco. She reports current alcohol use. She reports that she does not use drugs.  ROS:                                         Physical Exam: BP 133/84   Pulse 81   LMP 07/21/2020   Constitutional:  Alert and oriented, No acute distress.   Laboratory Data: No results found for: WBC, HGB, HCT, MCV, PLT  No results found for: CREATININE  No results found for: PSA  No results found for: TESTOSTERONE  No results found for: HGBA1C  Urinalysis    Component Value Date/Time   COLORURINE Yellow 10/09/2013 0859   APPEARANCEUR [place] (A) 06/30/2020 0958   LABSPEC 1.006 10/09/2013 0859   PHURINE 6.0 10/09/2013 0859   GLUCOSEU Negative 06/30/2020 0958   GLUCOSEU Negative 10/09/2013  [place] Negative 10/09/2013 0859   BILIRUBINUR Negative 06/30/2020 [place] Negative 10/09/2013 [place] Negative 10/09/2013 0859   PROTEINUR Negative 06/30/2020 0958   PROTEINUR Negative 10/09/2013 0859   UROBILINOGEN 0.2 05/07/2020 1122   NITRITE Negative 06/30/2020 0958   NITRITE Negative 10/09/2013 0859   LEUKOCYTESUR Trace (A) 06/30/2020 0958   LEUKOCYTESUR Negative 10/09/2013 0859    Pertinent Imaging:   Assessment & Plan: I will treat the patient is a breakthrough infection.  I do not think she is interstitial cystitis but sometimes her cultures are positive and other times negative.  I sent the urine for culture.  I switched her to daily Macrodantin 100 mg.  I would like to perform cystoscopy in about 8 weeks for safety reasons  1. Urinary tract infection without hematuria, site unspecified  - Urinalysis, Complete   No follow-ups on file.  Reece Packer, MD  [facility] [address], [place],  [address] [phone]

## 2020-07-22 LAB — URINALYSIS, COMPLETE
Bilirubin, UA: NEGATIVE
Glucose, UA: NEGATIVE
Ketones, UA: NEGATIVE
Leukocytes,UA: NEGATIVE
Nitrite, UA: NEGATIVE
Protein,UA: NEGATIVE
Specific Gravity, UA: 1.015 (ref 1.005–1.030)
Urobilinogen, Ur: 0.2 mg/dL (ref 0.2–1.0)
pH, UA: 7 (ref 5.0–7.5)

## 2020-07-22 LAB — MICROSCOPIC EXAMINATION

## 2020-07-25 LAB — CULTURE, URINE COMPREHENSIVE

## 2020-07-29 MED ORDER — ESTRING 2 MG VA RING: 2.0000 mg | VAGINAL_RING | VAGINAL | 3 refills | Status: DC

## 2020-07-29 NOTE — Addendum Note (Signed)
Addended by: Augusto Gamble on: 07/29/2020 11:01 PM   Modules accepted: Orders

## 2020-08-08 DIAGNOSIS — Z419 Encounter for procedure for purposes other than remedying health state, unspecified: Secondary | ICD-10-CM | POA: Diagnosis not present

## 2020-08-14 ENCOUNTER — Other Ambulatory Visit: Payer: Self-pay

## 2020-08-14 ENCOUNTER — Ambulatory Visit (INDEPENDENT_AMBULATORY_CARE_PROVIDER_SITE_OTHER): Payer: Medicaid Other | Admitting: Obstetrics and Gynecology

## 2020-08-14 ENCOUNTER — Encounter: Payer: Self-pay | Admitting: Obstetrics and Gynecology

## 2020-08-14 VITALS — BP 118/82 | HR 89 | Ht 64.0 in | Wt 173.4 lb

## 2020-08-14 DIAGNOSIS — N952 Postmenopausal atrophic vaginitis: Secondary | ICD-10-CM | POA: Diagnosis not present

## 2020-08-14 DIAGNOSIS — N9419 Other specified dyspareunia: Secondary | ICD-10-CM | POA: Diagnosis not present

## 2020-08-14 NOTE — Progress Notes (Signed)
    GYNECOLOGY PROGRESS NOTE  Subjective:    Patient ID: Natalie [name], female    DOB: 11/18/1975, 44 y.o.   MRN: [id number]  HPI  Patient is a 44 y.o. [id number] female who presents for Estring insertion for moderate vaginal atrophy. Still notes burning with intercourse.  Denies other complaints today.   The following portions of the patient's history were reviewed and updated as appropriate: allergies, current medications, past family history, past medical history, past social history, past surgical history and problem list.  Review of Systems Pertinent items noted in HPI and remainder of comprehensive ROS otherwise negative.   Objective:   Blood pressure 118/82, pulse 89, height 5' 4"  (1.626 m), weight 173 lb 6.4 oz (78.7 kg), last menstrual period 07/21/2020. General appearance: alert and no distress Pelvic: external genitalia normal, no lesions.  Remainder of exam deferred.    Assessment:   Vaginal atrophy Dyspareunia  Plan:   - Patient taught how to insert and remove Estring device.  Informed that she can have intercourse with ring in place. To f/u in 3 months to reassess symptoms and for removal and insertion of new ring.  - Dyspareunia should improve with Estring, however also given samples of Lucretia Field for comfort with intercourse.    Rubie Maid, MD [facility]

## 2020-08-14 NOTE — Progress Notes (Signed)
Pt present for Estradiol ring insertion. Pt stated that she was doing well just unsure of how to insert the ring.

## 2020-08-27 DIAGNOSIS — E039 Hypothyroidism, unspecified: Secondary | ICD-10-CM | POA: Diagnosis not present

## 2020-08-27 DIAGNOSIS — E782 Mixed hyperlipidemia: Secondary | ICD-10-CM | POA: Diagnosis not present

## 2020-09-08 DIAGNOSIS — Z419 Encounter for procedure for purposes other than remedying health state, unspecified: Secondary | ICD-10-CM | POA: Diagnosis not present

## 2020-09-15 ENCOUNTER — Other Ambulatory Visit: Payer: Self-pay

## 2020-09-15 ENCOUNTER — Ambulatory Visit (INDEPENDENT_AMBULATORY_CARE_PROVIDER_SITE_OTHER): Payer: Medicaid Other | Admitting: Urology

## 2020-09-15 ENCOUNTER — Encounter: Payer: Self-pay | Admitting: Urology

## 2020-09-15 VITALS — BP 126/83 | HR 92

## 2020-09-15 DIAGNOSIS — N390 Urinary tract infection, site not specified: Secondary | ICD-10-CM

## 2020-09-15 DIAGNOSIS — N3946 Mixed incontinence: Secondary | ICD-10-CM | POA: Diagnosis not present

## 2020-09-15 LAB — URINALYSIS, COMPLETE
Bilirubin, UA: NEGATIVE
Glucose, UA: NEGATIVE
Ketones, UA: NEGATIVE
Leukocytes,UA: NEGATIVE
Nitrite, UA: NEGATIVE
Protein,UA: NEGATIVE
RBC, UA: NEGATIVE
Specific Gravity, UA: 1.02 (ref 1.005–1.030)
Urobilinogen, Ur: 0.2 mg/dL (ref 0.2–1.0)
pH, UA: 7.5 (ref 5.0–7.5)

## 2020-09-15 LAB — MICROSCOPIC EXAMINATION: RBC, Urine: NONE SEEN /hpf (ref 0–2)

## 2020-09-15 MED ORDER — NITROFURANTOIN MONOHYD MACRO 100 MG PO CAPS: 100.0000 mg | ORAL_CAPSULE | Freq: Every day | ORAL | 11 refills | Status: DC

## 2020-09-15 NOTE — Addendum Note (Signed)
Addended by: Verlene Mayer A on: 09/15/2020 10:36 AM   Modules accepted: Orders

## 2020-09-15 NOTE — Progress Notes (Signed)
09/15/2020 10:23 AM   [name] 11-Dec-1975 [id number]  Referring provider: Rubie Maid, [place],  [place] [address]  Chief Complaint  Patient presents with  . Cysto    HPI: I was consulted to assess the patient for possible overactive bladder or urinary tract infections. She thinks he gets 8 infections a year. She will get a strong urge and then she sit and cannot urinate. She will get a tingling feeling in the vaginal area. The symptoms seem to be more from a possible infection and they usually temporarily respond to an antibiotic.  I think she still has some of the symptoms when she is not infected. She voids every 1-2 hours gets up once at night. She is continent. She is on Toviaz which is helping some. She quit smoking 3 years ago.It appears that she tried Vesicare and oxybutynin. She still has some urgency but overall the Lisbeth Ply has helped  Pathophysiology of chronic cystitis discussed. Probiotics and suppression therapy discussed. Reassess in 8 weeks on trimethoprim 100 mg with 30x11. Stay on [place]. If she does down regulate consider stopping the Toviaz.  Clinicallyinfection free. Overactive bladder much improved. Renew Myrbetriq and trimethoprim 90x3 and recheck in 6 months  In May she called in with frequency and urgency but culture was negative Clinically I think the patient is infection free on daily trimethoprim. She still feels a little bit of pressure in the middle of the night with a tingle. She has had the symptoms before with or without infection.  She had a change in her insurance and they are not covering the Myrbetriq. She has failed Vesicare and oxybutynin and was a partial responder to [employer]. She is continent. I think she tends to be more symptomatic at night.  I do not think there is another good option for her at this stage based upon symptom severity.  Today Frequency stable.  Last 2 urine  cultures negative Approximately 2 weeks ago patient had burning and increased small-volume frequency.  Symptoms improving and was treated with Macrobid.  Still having some intermittent burning.  Still having some tingling.  Quit smoking 4 years ago.  I will treat the patient is a breakthrough infection.  I do not think she is interstitial cystitis but sometimes her cultures are positive and other times negative.  I sent the urine for culture.  I switched her to daily Macrodantin 100 mg.  I would like to perform cystoscopy in about 8 weeks for safety reasons  Today Frequency stable.  Last culture negative Clinically infection free doing great on daily Macrodantin.  Incontinence greatly improved on Toviaz and pleased.  No vague pelvic symptoms.  Cystoscopy: Patient underwent flexible cystoscopy utilizing sterile technique.  Bladder mucosa and trigone were normal.  No foreign body.  No carcinoma.   PMH: Past Medical History:  Diagnosis Date  . Bacterial vaginosis   . Dysplasia of cervix, low grade (CIN 1) 02/15/2017   Will need repeat pap smear in 1 year.   Marland Kitchen History of Graves' disease   . Hyperlipemia   . Hypertension   . Marfan syndrome   . Ovarian cyst   . Vitamin D deficiency     Surgical History: Past Surgical History:  Procedure Laterality Date  . DILATION AND CURETTAGE OF UTERUS    . TONSILECTOMY/ADENOIDECTOMY WITH MYRINGOTOMY    . TUBAL LIGATION      Home Medications:  Allergies as of 09/15/2020      Reactions  Hydrocodone-acetaminophen Itching   Oxycodone-acetaminophen Other (See Comments), Itching      Medication List       Accurate as of September 15, 2020 10:23 AM. If you have any questions, ask your nurse or doctor.        atorvastatin 40 MG tablet Commonly known as: LIPITOR Take by mouth.   Estring 2 MG vaginal ring Generic drug: estradiol Place 2 mg vaginally every 3 (three) months. follow package directions   fesoterodine 8 MG Tb24 tablet Commonly  known as: TOVIAZ Take 1 tablet (8 mg total) by mouth daily.   levothyroxine 50 MCG tablet Commonly known as: SYNTHROID Take 50 mcg by mouth daily before breakfast.   nitrofurantoin (macrocrystal-monohydrate) 100 MG capsule Commonly known as: MACROBID Take 1 capsule (100 mg total) by mouth daily.   pantoprazole 40 MG tablet Commonly known as: PROTONIX Take 40 mg by mouth daily.   PROBIOTIC DAILY PO Take by mouth.   traZODone 100 MG tablet Commonly known as: DESYREL Take 100 mg by mouth at bedtime.   triamterene-hydrochlorothiazide 37.5-25 MG tablet Commonly known as: MAXZIDE-25 Take 1 tablet by mouth daily.   Vitamin D (Ergocalciferol) 1.25 MG (50000 UNIT) Caps capsule Commonly known as: DRISDOL Take 1 capsule (50,000 Units total) by mouth once a week.   Vitamin D3 1.25 MG (50000 UT) Caps Take 1 capsule by mouth once a week.       Allergies:  Allergies  Allergen Reactions  . Hydrocodone-Acetaminophen Itching  . Oxycodone-Acetaminophen Other (See Comments) and Itching    Family History: Family History  Problem Relation Age of Onset  . Hyperlipidemia Mother   . Hyperthyroidism Mother   . Thyroid cancer Father   . Marfan syndrome Father   . Marfan syndrome Sister   . Depression Sister   . Depression Brother   . Breast cancer Paternal Grandmother        great PGM    Social History:  reports that she quit smoking about 3 years ago. Her smoking use included cigarettes. She has a 5.00 pack-year smoking history. She has never used smokeless tobacco. She reports previous alcohol use. She reports that she does not use drugs.  ROS:                                        Physical Exam: BP 126/83   Pulse 92   Constitutional:  Alert and oriented, No acute distress.   Laboratory Data: No results found for: WBC, HGB, HCT, MCV, PLT  No results found for: CREATININE  No results found for: PSA  No results found for: TESTOSTERONE  No  results found for: HGBA1C  Urinalysis    Component Value Date/Time   COLORURINE Yellow 10/09/2013 0859   APPEARANCEUR Cloudy (A) 07/21/2020 0917   LABSPEC 1.006 10/09/2013 0859   PHURINE 6.0 10/09/2013 0859   GLUCOSEU Negative 07/21/2020 0917   GLUCOSEU Negative 10/09/2013 0859   HGBUR Negative 10/09/2013 0859   BILIRUBINUR Negative 07/21/2020 0917   BILIRUBINUR Negative 10/09/2013 0859   KETONESUR Negative 10/09/2013 0859   PROTEINUR Negative 07/21/2020 0917   PROTEINUR Negative 10/09/2013 0859   UROBILINOGEN 0.2 05/07/2020 1122   NITRITE Negative 07/21/2020 0917   NITRITE Negative 10/09/2013 0859   LEUKOCYTESUR Negative 07/21/2020 0917   LEUKOCYTESUR Negative 10/09/2013 0859    Pertinent Imaging:   Assessment & Plan: Scription renewed and I will see her in  1 year  1. Urinary tract infection without hematuria, site unspecified  - Urinalysis, Complete   No follow-ups on file.  Reece Packer, MD  [facility] [address] [place], [place] [address] [phone]

## 2020-09-24 DIAGNOSIS — J329 Chronic sinusitis, unspecified: Secondary | ICD-10-CM | POA: Diagnosis not present

## 2020-09-24 DIAGNOSIS — G5601 Carpal tunnel syndrome, right upper limb: Secondary | ICD-10-CM | POA: Diagnosis not present

## 2020-09-29 ENCOUNTER — Ambulatory Visit: Payer: Self-pay | Admitting: Urology

## 2020-10-07 DIAGNOSIS — G5603 Carpal tunnel syndrome, bilateral upper limbs: Secondary | ICD-10-CM | POA: Diagnosis not present

## 2020-10-08 DIAGNOSIS — Z419 Encounter for procedure for purposes other than remedying health state, unspecified: Secondary | ICD-10-CM | POA: Diagnosis not present

## 2020-10-21 DIAGNOSIS — R200 Anesthesia of skin: Secondary | ICD-10-CM | POA: Diagnosis not present

## 2020-10-30 DIAGNOSIS — R200 Anesthesia of skin: Secondary | ICD-10-CM

## 2020-10-30 HISTORY — DX: Anesthesia of skin: R20.0

## 2020-11-08 DIAGNOSIS — Z419 Encounter for procedure for purposes other than remedying health state, unspecified: Secondary | ICD-10-CM | POA: Diagnosis not present

## 2020-11-13 ENCOUNTER — Encounter: Payer: Medicaid Other | Admitting: Obstetrics and Gynecology

## 2020-11-14 ENCOUNTER — Encounter: Payer: Medicaid Other | Admitting: Obstetrics and Gynecology

## 2020-11-14 IMAGING — MG DIGITAL SCREENING BILATERAL MAMMOGRAM WITH TOMO AND CAD
8 series · 8 of 24 positions shown · non-contrast
Comparison: Previous exam(s).

CLINICAL DATA: Screening.

EXAM:
DIGITAL SCREENING BILATERAL MAMMOGRAM WITH TOMO AND CAD

[L CC synth-2D]
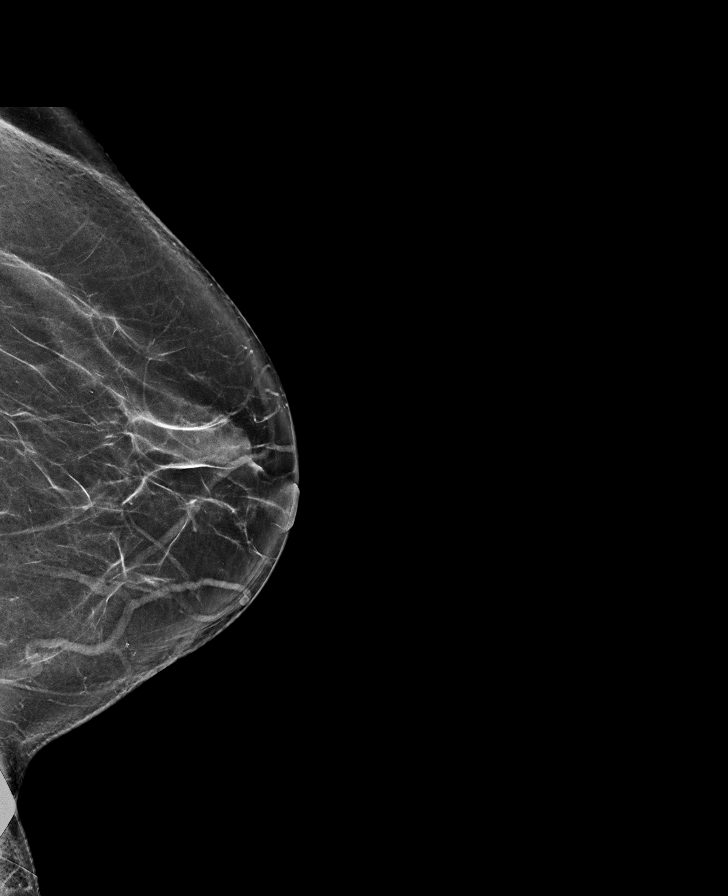

[R CC synth-2D]
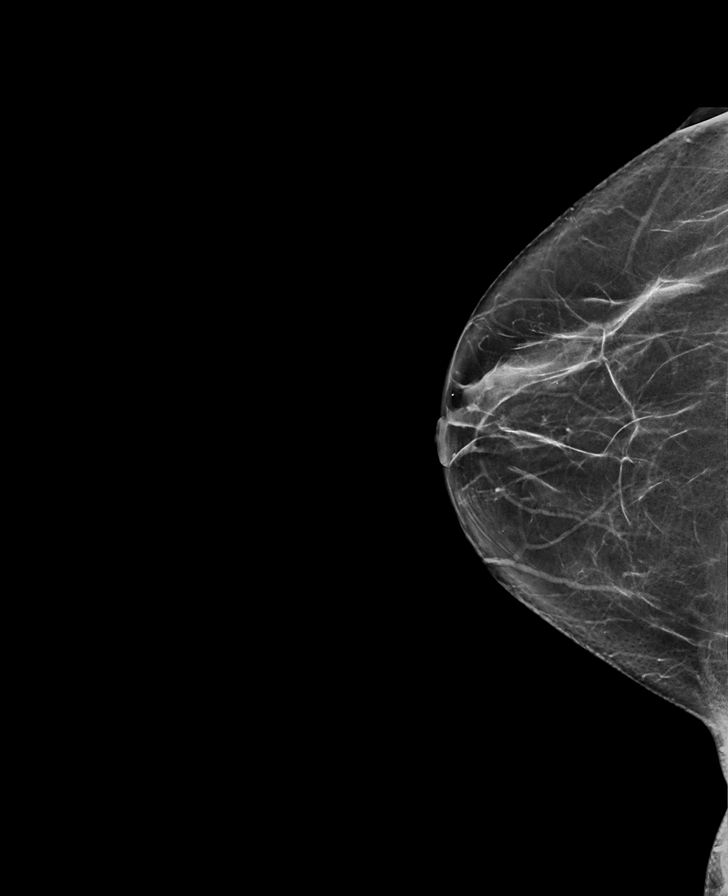

[L MLO synth-2D]
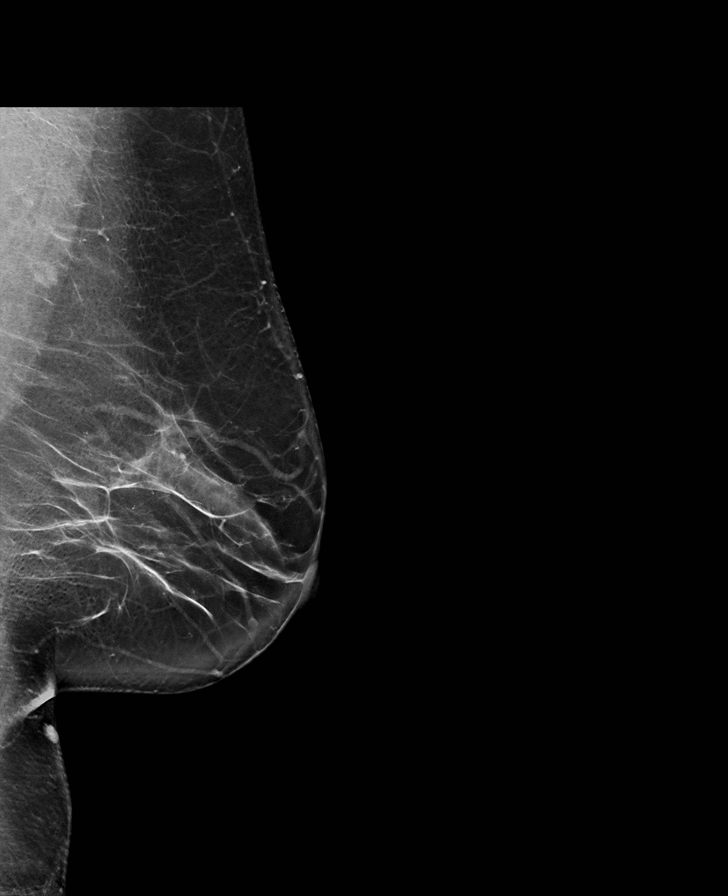

[R MLO synth-2D]
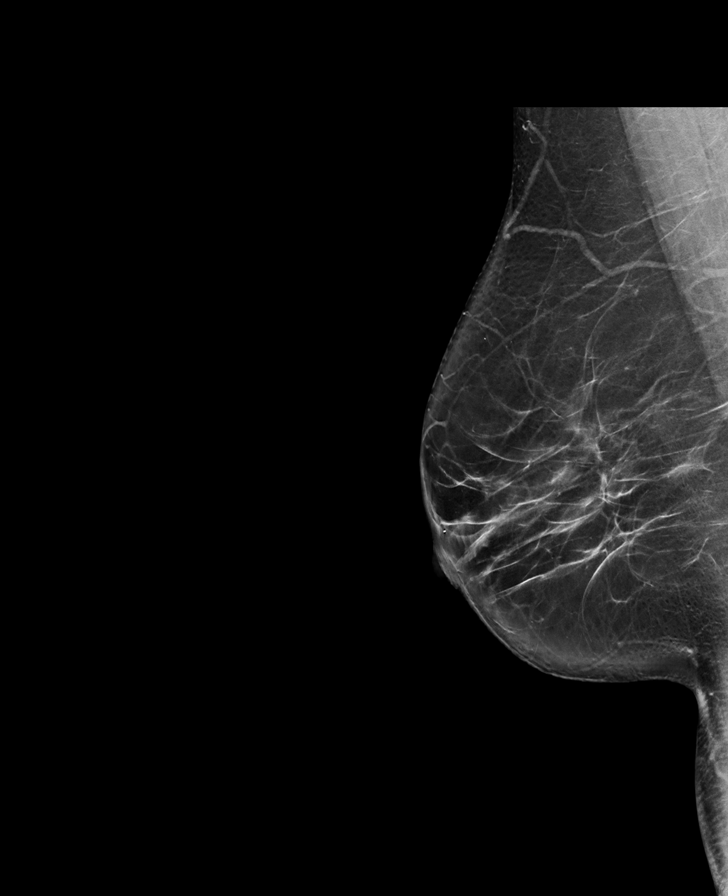

[R CC tomo · tomo slice 35/69.0]
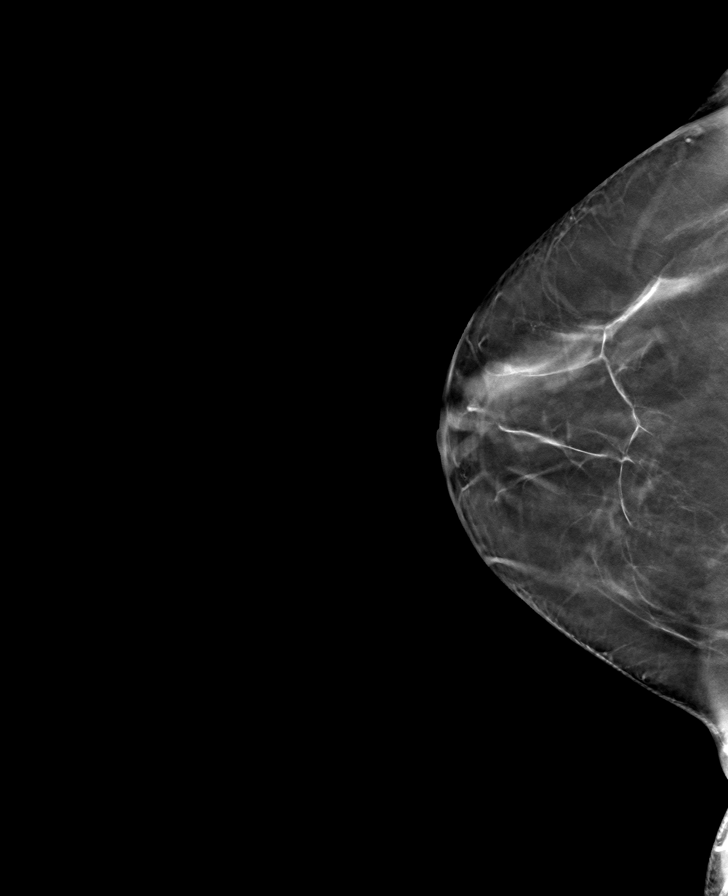

[R MLO tomo · tomo slice 39/78.0]
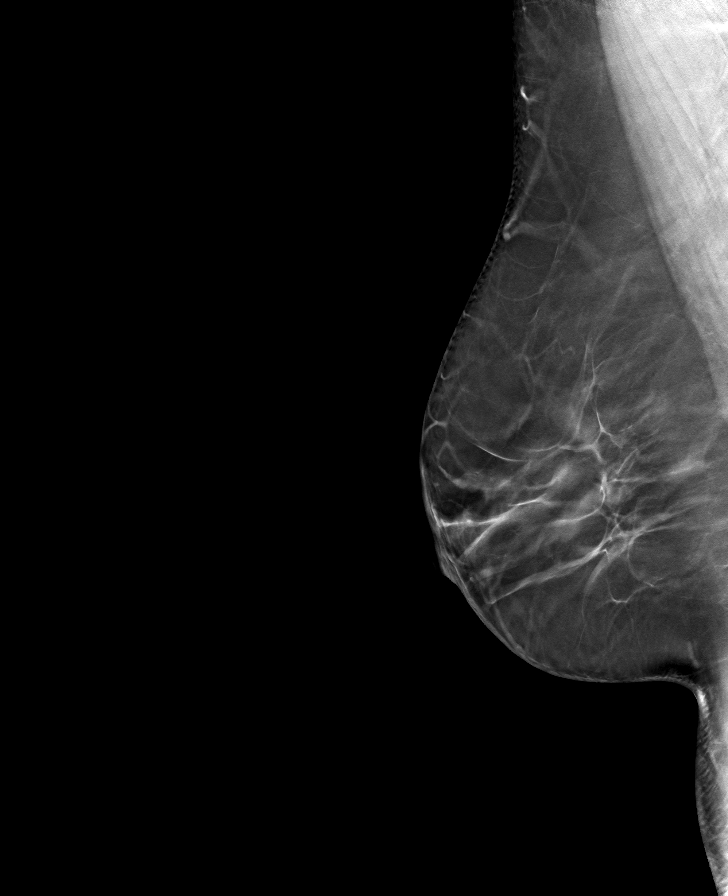

[L MLO tomo · tomo slice 45/89.0]
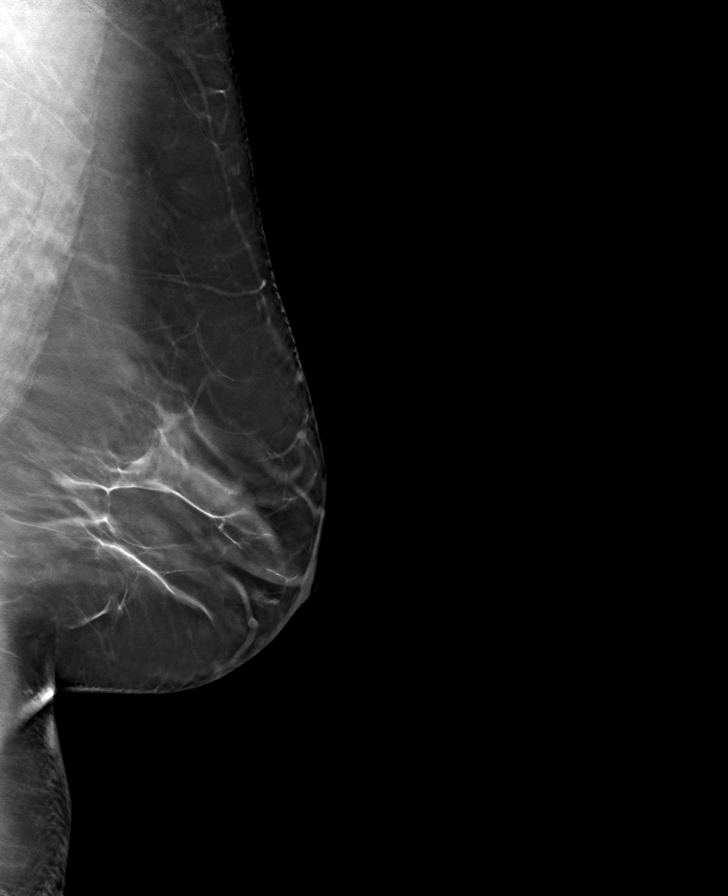

[L CC tomo · tomo slice 39/76.0]
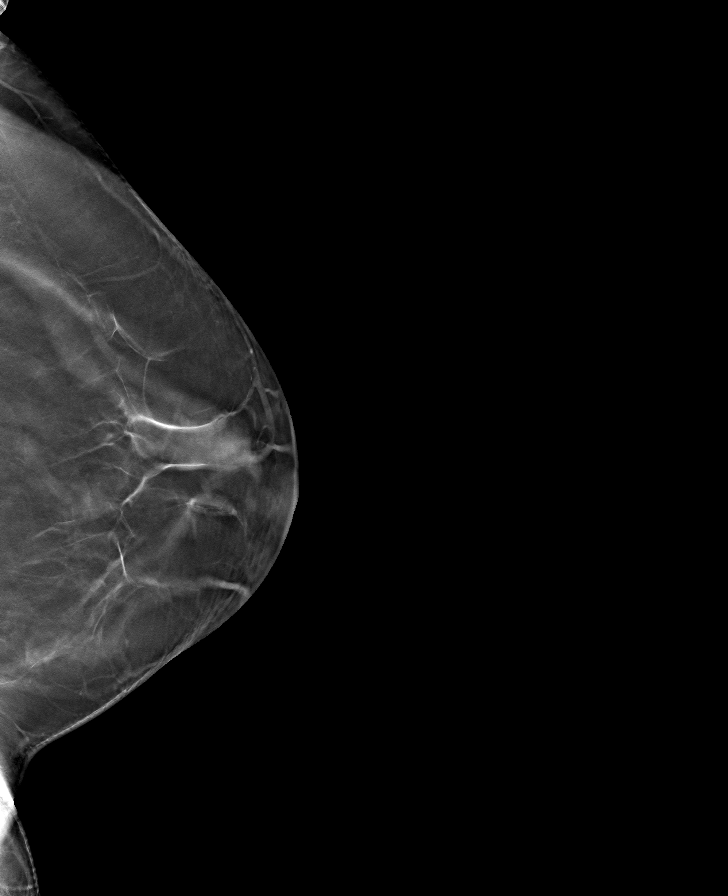

[8 of 24 positions shown; findings below may reference images not displayed]

ACR Breast Density Category b: There are scattered areas of
fibroglandular density.
FINDINGS: There are no findings suspicious for malignancy. Images were
processed with CAD.
IMPRESSION: No mammographic evidence of malignancy. A result letter of this
screening mammogram will be mailed directly to the patient.

RECOMMENDATION:
Screening mammogram in one year. (Code:[id number])

BI-RADS CATEGORY  1: Negative.

## 2020-11-18 ENCOUNTER — Telehealth: Payer: Self-pay

## 2020-11-18 ENCOUNTER — Encounter: Payer: Self-pay | Admitting: Obstetrics and Gynecology

## 2020-11-18 NOTE — Telephone Encounter (Signed)
Error

## 2020-11-19 ENCOUNTER — Telehealth: Payer: Self-pay | Admitting: Obstetrics and Gynecology

## 2020-11-19 NOTE — Telephone Encounter (Signed)
Attempted to reach Natalie [name] to schedule her a phone visit with the [place] [employer] RN Case Manager. I left my name and number for her to return my call. I will attempt to reach her again in 7-14 days.

## 2020-12-09 DIAGNOSIS — Z419 Encounter for procedure for purposes other than remedying health state, unspecified: Secondary | ICD-10-CM | POA: Diagnosis not present

## 2020-12-11 ENCOUNTER — Telehealth: Payer: Self-pay | Admitting: Obstetrics and Gynecology

## 2020-12-11 NOTE — Telephone Encounter (Signed)
This was 2nd attempt to reach out to Natalie [name] to get her scheduled for a phone visit with the Managed Medicaid Team. I left my contact info for her to call me back. I will reach out again in the next 7-14 days if I do not hear back from her.

## 2020-12-16 ENCOUNTER — Other Ambulatory Visit: Payer: Self-pay | Admitting: Obstetrics and Gynecology

## 2020-12-16 NOTE — Patient Outreach (Signed)
Care Coordination  12/16/2020  Marlea Gambill 1975-11-11 [id number]    Medicaid Managed Care   Unsuccessful Outreach Note  12/16/2020 Name: Natalie [name] MRN: [id number] DOB: Mar 02, 1976  Referred by: Rubie Maid, MD Reason for referral : High Risk Managed Medicaid (Unsuccessful telephone outreach)   An unsuccessful telephone outreach was attempted today. The patient was referred to the case management team for assistance with care management and care coordination.   Follow Up Plan: A member of the Managed Medicaid  care management team will reach out to the patient again over the next 7 days.   Aida Raider RN, BSN [place]  [facility] - Managed Medicaid High Risk [phone].

## 2020-12-16 NOTE — Patient Instructions (Signed)
Hi [name], sorry we missed you today - as a part of your Medicaid benefit, you are eligible for care management and care coordination services at no cost or copay. I was unable to reach you by phone today but would be happy to help you with your health related needs. Please feel free to call meat [phone].  A member of the Managed Medicaid care management team will reach out to you again over the next 7 days.   Natalie Raider RN, BSN [place]  [facility] - Managed Medicaid High Risk [phone].

## 2020-12-17 ENCOUNTER — Other Ambulatory Visit: Payer: Self-pay

## 2020-12-17 ENCOUNTER — Telehealth: Payer: Self-pay | Admitting: Obstetrics and Gynecology

## 2020-12-17 ENCOUNTER — Encounter: Payer: Self-pay | Admitting: Obstetrics and Gynecology

## 2020-12-17 ENCOUNTER — Ambulatory Visit (INDEPENDENT_AMBULATORY_CARE_PROVIDER_SITE_OTHER): Payer: Medicaid Other | Admitting: Obstetrics and Gynecology

## 2020-12-17 VITALS — BP 114/79 | HR 91 | Ht 64.0 in | Wt 202.6 lb

## 2020-12-17 DIAGNOSIS — N9419 Other specified dyspareunia: Secondary | ICD-10-CM

## 2020-12-17 DIAGNOSIS — N958 Other specified menopausal and perimenopausal disorders: Secondary | ICD-10-CM

## 2020-12-17 DIAGNOSIS — N952 Postmenopausal atrophic vaginitis: Secondary | ICD-10-CM

## 2020-12-17 NOTE — Telephone Encounter (Signed)
I attempted to reach Natalie [name] today to see about getting her phone visit rescheduled. She was scheduled for 12/16/20 with the [facility] with the Managed Medicaid Team but she did not answer when it was time for her appt. I left my contact info for her to call me back. I will reach out again in the next 7-14 days if I have not heard from her.

## 2020-12-17 NOTE — Progress Notes (Signed)
    GYNECOLOGY PROGRESS NOTE  Subjective:    Patient ID: Natalie [name], female    DOB: 1976/09/30, 45 y.o.   MRN: [id number]  HPI  Patient is a 45 y.o. [id number] female who presents for management of her Estring. Notes that she desires to take a break as she is feeling overall better.  Was unable to remove the ring at home. No other complaints today.   The following portions of the patient's history were reviewed and updated as appropriate: allergies, current medications, past family history, past medical history, past social history, past surgical history and problem list.  Review of Systems Pertinent items noted in HPI and remainder of comprehensive ROS otherwise negative.   Objective:   Blood pressure 114/79, pulse 91, height 5' 4"  (1.626 m), weight 202 lb 9 oz (91.9 kg). General appearance: alert and no distress Abdomen: soft, non-tender; bowel sounds normal; no masses,  no organomegaly Pelvic: external genitalia normal, rectovaginal septum normal.  Vagina without discharge.  Vaginal ring removed    Assessment:   Vaginal atrophy Dyspareunia   Plan:   Patient desires Estring removed today as it is time. Does not desire to have a new one placed, as she notes she is feeling fine right now. When ready to resume, she is able to place a new one at home.    Rubie Maid, MD [facility]

## 2020-12-17 NOTE — Progress Notes (Signed)
Pt present for estradiol ring removal. Pt stated that she is unable to get the ring out.

## 2020-12-18 ENCOUNTER — Encounter: Payer: Self-pay | Admitting: Obstetrics and Gynecology

## 2020-12-19 DIAGNOSIS — F39 Unspecified mood [affective] disorder: Secondary | ICD-10-CM | POA: Diagnosis not present

## 2020-12-19 DIAGNOSIS — G4700 Insomnia, unspecified: Secondary | ICD-10-CM | POA: Diagnosis not present

## 2020-12-19 DIAGNOSIS — F419 Anxiety disorder, unspecified: Secondary | ICD-10-CM | POA: Diagnosis not present

## 2020-12-25 DIAGNOSIS — R42 Dizziness and giddiness: Secondary | ICD-10-CM | POA: Diagnosis not present

## 2020-12-25 DIAGNOSIS — Z6833 Body mass index (BMI) 33.0-33.9, adult: Secondary | ICD-10-CM | POA: Diagnosis not present

## 2021-01-01 DIAGNOSIS — R300 Dysuria: Secondary | ICD-10-CM | POA: Diagnosis not present

## 2021-01-01 DIAGNOSIS — R42 Dizziness and giddiness: Secondary | ICD-10-CM | POA: Diagnosis not present

## 2021-01-01 DIAGNOSIS — N3281 Overactive bladder: Secondary | ICD-10-CM | POA: Diagnosis not present

## 2021-01-01 DIAGNOSIS — F419 Anxiety disorder, unspecified: Secondary | ICD-10-CM | POA: Diagnosis not present

## 2021-01-06 DIAGNOSIS — Z419 Encounter for procedure for purposes other than remedying health state, unspecified: Secondary | ICD-10-CM | POA: Diagnosis not present

## 2021-01-12 DIAGNOSIS — K047 Periapical abscess without sinus: Secondary | ICD-10-CM | POA: Diagnosis not present

## 2021-01-12 DIAGNOSIS — M549 Dorsalgia, unspecified: Secondary | ICD-10-CM | POA: Diagnosis not present

## 2021-01-12 DIAGNOSIS — E559 Vitamin D deficiency, unspecified: Secondary | ICD-10-CM | POA: Diagnosis not present

## 2021-01-12 DIAGNOSIS — R059 Cough, unspecified: Secondary | ICD-10-CM | POA: Diagnosis not present

## 2021-01-13 DIAGNOSIS — R059 Cough, unspecified: Secondary | ICD-10-CM | POA: Diagnosis not present

## 2021-01-13 DIAGNOSIS — G8929 Other chronic pain: Secondary | ICD-10-CM | POA: Diagnosis not present

## 2021-01-13 DIAGNOSIS — M545 Low back pain, unspecified: Secondary | ICD-10-CM | POA: Diagnosis not present

## 2021-01-13 DIAGNOSIS — M5136 Other intervertebral disc degeneration, lumbar region: Secondary | ICD-10-CM | POA: Diagnosis not present

## 2021-01-23 DIAGNOSIS — H5213 Myopia, bilateral: Secondary | ICD-10-CM | POA: Diagnosis not present

## 2021-01-26 DIAGNOSIS — R42 Dizziness and giddiness: Secondary | ICD-10-CM | POA: Diagnosis not present

## 2021-01-26 DIAGNOSIS — Q8740 Marfan's syndrome, unspecified: Secondary | ICD-10-CM | POA: Diagnosis not present

## 2021-01-26 DIAGNOSIS — I340 Nonrheumatic mitral (valve) insufficiency: Secondary | ICD-10-CM | POA: Diagnosis not present

## 2021-01-26 DIAGNOSIS — M549 Dorsalgia, unspecified: Secondary | ICD-10-CM | POA: Diagnosis not present

## 2021-02-06 DIAGNOSIS — Z419 Encounter for procedure for purposes other than remedying health state, unspecified: Secondary | ICD-10-CM | POA: Diagnosis not present

## 2021-02-16 DIAGNOSIS — R1084 Generalized abdominal pain: Secondary | ICD-10-CM | POA: Diagnosis not present

## 2021-02-16 DIAGNOSIS — F39 Unspecified mood [affective] disorder: Secondary | ICD-10-CM | POA: Diagnosis not present

## 2021-02-16 DIAGNOSIS — F419 Anxiety disorder, unspecified: Secondary | ICD-10-CM | POA: Diagnosis not present

## 2021-02-16 DIAGNOSIS — M549 Dorsalgia, unspecified: Secondary | ICD-10-CM | POA: Diagnosis not present

## 2021-02-27 DIAGNOSIS — Z6832 Body mass index (BMI) 32.0-32.9, adult: Secondary | ICD-10-CM | POA: Diagnosis not present

## 2021-02-27 DIAGNOSIS — T50B95A Adverse effect of other viral vaccines, initial encounter: Secondary | ICD-10-CM | POA: Diagnosis not present

## 2021-03-08 DIAGNOSIS — Z419 Encounter for procedure for purposes other than remedying health state, unspecified: Secondary | ICD-10-CM | POA: Diagnosis not present

## 2021-03-12 ENCOUNTER — Other Ambulatory Visit: Payer: Self-pay | Admitting: Obstetrics and Gynecology

## 2021-03-12 NOTE — Patient Outreach (Signed)
Care Coordination  03/12/2021  Natalie [name] 1975/11/21 [id number]    Medicaid Managed Care   Unsuccessful Outreach Note  03/12/2021 Name: Natalie [name] MRN: [id number] DOB: 02/21/1976  Referred by: Rubie Maid, MD Reason for referral : High Risk Managed Medicaid (Unsuccessful telephone outreach)   Follow Up Plan: We have been unable to make contact with the patient for follow up. The care management team is available to follow up with the patient after provider conversation with the patient regarding recommendation for care management engagement and subsequent re-referral to the care management team.   Aida Raider RN, BSN [place] Management Coordinator - Managed [facility] High Risk [phone].

## 2021-03-16 ENCOUNTER — Ambulatory Visit (INDEPENDENT_AMBULATORY_CARE_PROVIDER_SITE_OTHER): Payer: Medicaid Other | Admitting: Cardiology

## 2021-03-16 ENCOUNTER — Other Ambulatory Visit: Payer: Self-pay

## 2021-03-16 ENCOUNTER — Encounter: Payer: Self-pay | Admitting: Cardiology

## 2021-03-16 VITALS — BP 108/78 | HR 98 | Ht 64.0 in | Wt 195.0 lb

## 2021-03-16 DIAGNOSIS — Q8740 Marfan's syndrome, unspecified: Secondary | ICD-10-CM

## 2021-03-16 DIAGNOSIS — E7800 Pure hypercholesterolemia, unspecified: Secondary | ICD-10-CM | POA: Diagnosis not present

## 2021-03-16 DIAGNOSIS — I10 Essential (primary) hypertension: Secondary | ICD-10-CM | POA: Diagnosis not present

## 2021-03-16 NOTE — Patient Instructions (Signed)
Medication Instructions:  Please continue your current medications   *If you need a refill on your cardiac medications before your next appointment, please call your pharmacy*   Lab Work: None  Testing/Procedures: Echo  Your physician has requested that you have an echocardiogram. Echocardiography is a painless test that uses sound waves to create images of your heart. It provides your doctor with information about the size and shape of your heart and how well your heart's chambers and valves are working. This procedure takes approximately one hour. There are no restrictions for this procedure.  There is a possibility that an IV may need to be started during your test to inject an image enhancing agent. This is done to obtain more optimal pictures of your heart. Therefore we ask that you do at least drink some water prior to coming in to hydrate your veins.     Follow-Up: At [facility], you and your health needs are our priority.  As part of our continuing mission to provide you with exceptional heart care, we have created designated Provider Care Teams.  These Care Teams include your primary Cardiologist (physician) and Advanced Practice Providers (APPs -  Physician Assistants and Nurse Practitioners) who all work together to provide you with the care you need, when you need it.  Your next appointment:   Please follow-up after your schedule echo to review results.   The format for your next appointment:   In Person  Provider:   You may see [facility] or one of the following Advanced Practice Providers on your designated Care Team:    Murray Hodgkins, NP  Christell Faith, PA-C  Marrianne Mood, PA-C  Cadence [place], [place]

## 2021-03-16 NOTE — Progress Notes (Signed)
Cardiology Office Note:    Date:  03/16/2021   ID:  [name], DOB [birth year], MRN [id number]  PCP:  Rubie Maid, MD   [facility] Providers Cardiologist:  None     Referring MD: Ileene Hutchinson, PA   Chief Complaint  Patient presents with  . New patient    Referred by PCP for Marfans Syndrome. Meds reviewed verbally with patient.     History of Present Illness:    Natalie [name] is a 45 y.o. female with a hx of hypertension, hyperlipidemia, Marfan syndrome who presents to establish care due to history of Marfan's.  States being diagnosed of Marfan syndrome a couple of years ago, follows up with cardiology a few times but not over the past several years.  Her father who also has Marfan's, did have aortic dilatation needing surgery in his 68s.  She denies chest pain or shortness of breath.  Feels well, takes all medications as prescribed, BP usually well controlled.  Past Medical History:  Diagnosis Date  . Bacterial vaginosis   . Dysplasia of cervix, low grade (CIN 1) 02/15/2017   Will need repeat pap smear in 1 year.   Marland Kitchen History of Graves' disease   . Hyperlipemia   . Hypertension   . Marfan syndrome   . Ovarian cyst   . Vitamin D deficiency     Past Surgical History:  Procedure Laterality Date  . DILATION AND CURETTAGE OF UTERUS    . TONSILECTOMY/ADENOIDECTOMY WITH MYRINGOTOMY    . TUBAL LIGATION      Current Medications: Current Meds  Medication Sig  . atorvastatin (LIPITOR) 40 MG tablet Take by mouth.  Marland Kitchen buPROPion (WELLBUTRIN XL) 150 MG 24 hr tablet Take 1 tablet by mouth every morning.  . busPIRone (BUSPAR) 10 MG tablet Take 10 mg by mouth 3 (three) times daily as needed.  . butalbital-acetaminophen-caffeine (FIORICET) 50-325-40 MG tablet SMARTSIG:1-3 Tablet(s) By Mouth Every 4 Hours PRN  . Cholecalciferol (VITAMIN D3) 1.25 MG (50000 UT) CAPS Take 1 capsule by mouth once a week.   . DULoxetine (CYMBALTA) 60 MG capsule Take 60 mg by mouth daily.  .  fesoterodine (TOVIAZ) 8 MG TB24 tablet Take 1 tablet (8 mg total) by mouth daily.  Marland Kitchen levothyroxine (SYNTHROID) 50 MCG tablet Take 50 mcg by mouth daily before breakfast.  . nitrofurantoin, macrocrystal-monohydrate, (MACROBID) 100 MG capsule Take 100 mg by mouth 2 (two) times daily.  . pantoprazole (PROTONIX) 40 MG tablet Take 40 mg by mouth daily.  . Probiotic Product (PROBIOTIC DAILY PO) Take by mouth.  . triamterene-hydrochlorothiazide (MAXZIDE-25) 37.5-25 MG tablet Take 1 tablet by mouth daily.     Allergies:   Hydrocodone-acetaminophen and Oxycodone-acetaminophen   Social History   Socioeconomic History  . Marital status: Married    Spouse name: Not on file  . Number of children: Not on file  . Years of education: Not on file  . Highest education level: Not on file  Occupational History  . Not on file  Tobacco Use  . Smoking status: Former Smoker    Packs/day: 0.50    Years: 10.00    Pack years: 5.00    Types: Cigarettes    Quit date: 12/12/2016    Years since quitting: 4.2  . Smokeless tobacco: Never Used  Vaping Use  . Vaping Use: Never used  Substance and Sexual Activity  . Alcohol use: Not Currently    Alcohol/week: 0.0 standard drinks  . Drug use: No  . Sexual activity: Yes  Birth control/protection: None  Other Topics Concern  . Not on file  Social History Narrative  . Not on file   Social Determinants of Health   Financial Resource Strain: Not on file  Food Insecurity: Not on file  Transportation Needs: Not on file  Physical Activity: Not on file  Stress: Not on file  Social Connections: Not on file     Family History: The patient's family history includes Breast cancer in her paternal grandmother; Depression in her brother and sister; Hyperlipidemia in her mother; Hyperthyroidism in her mother; Marfan syndrome in her father and sister; Thyroid cancer in her father.  ROS:   Please see the history of present illness.     All other systems reviewed  and are negative.  EKGs/Labs/Other Studies Reviewed:    The following studies were reviewed today:   EKG:  EKG is  ordered today.  The ekg ordered today demonstrates normal sinus rhythm, normal ECG.  Recent Labs: No results found for requested labs within last 8760 hours.  Recent Lipid Panel No results found for: CHOL, TRIG, HDL, CHOLHDL, VLDL, LDLCALC, LDLDIRECT   Risk Assessment/Calculations:      Physical Exam:    VS:  BP 108/78 (BP Location: Left Arm, Patient Position: Sitting, Cuff Size: Normal)   Pulse 98   Ht 5' 4"  (1.626 m)   Wt 195 lb (88.5 kg)   SpO2 97%   BMI 33.47 kg/m     Wt Readings from Last 3 Encounters:  03/16/21 195 lb (88.5 kg)  12/17/20 202 lb 9 oz (91.9 kg)  08/14/20 173 lb 6.4 oz (78.7 kg)     GEN:  Well nourished, well developed in no acute distress HEENT: Normal NECK: No JVD; No carotid bruits LYMPHATICS: No lymphadenopathy CARDIAC: RRR, no murmurs, rubs, gallops RESPIRATORY:  Clear to auscultation without rales, wheezing or rhonchi  ABDOMEN: Soft, non-tender, non-distended MUSCULOSKELETAL:  No edema; No deformity  SKIN: Warm and dry NEUROLOGIC:  Alert and oriented x 3 PSYCHIATRIC:  Normal affect   ASSESSMENT:    1. Marfan's syndrome   2. Primary hypertension   3. Pure hypercholesterolemia    PLAN:    In order of problems listed above:  1. Patient with history of Marfan syndrome.  Aortic dilatation and surgery in her diet around age 56s.  Order echocardiogram to evaluate aortic size, valvular pathologies if any.  Consider CTA or MRI to evaluate thoracic aortic aneurysm if needed. 2. Hypertension, BP controlled, continue Maxzide. 3. Hyperlipidemia, continue Lipitor.  Follow-up after echocardiogram     Medication Adjustments/Labs and Tests Ordered: Current medicines are reviewed at length with the patient today.  Concerns regarding medicines are outlined above.  Orders Placed This Encounter  Procedures  . EKG 12-Lead  .  ECHOCARDIOGRAM COMPLETE   No orders of the defined types were placed in this encounter.   Patient Instructions  Medication Instructions:  Please continue your current medications   *If you need a refill on your cardiac medications before your next appointment, please call your pharmacy*   Lab Work: None  Testing/Procedures: Echo  Your physician has requested that you have an echocardiogram. Echocardiography is a painless test that uses sound waves to create images of your heart. It provides your doctor with information about the size and shape of your heart and how well your heart's chambers and valves are working. This procedure takes approximately one hour. There are no restrictions for this procedure.  There is a possibility that an IV may  need to be started during your test to inject an image enhancing agent. This is done to obtain more optimal pictures of your heart. Therefore we ask that you do at least drink some water prior to coming in to hydrate your veins.     Follow-Up: At [facility], you and your health needs are our priority.  As part of our continuing mission to provide you with exceptional heart care, we have created designated Provider Care Teams.  These Care Teams include your primary Cardiologist (physician) and Advanced Practice Providers (APPs -  Physician Assistants and Nurse Practitioners) who all work together to provide you with the care you need, when you need it.  Your next appointment:   Please follow-up after your schedule echo to review results.   The format for your next appointment:   In Person  Provider:   You may see [facility] or one of the following Advanced Practice Providers on your designated Care Team:    Murray Hodgkins, NP  Christell Faith, PA-C  Marrianne Mood, PA-C  Cadence [place]oh[place], [place]      Signed, Kate Sable, MD  03/16/2021 4:46 PM    [place]

## 2021-03-17 DIAGNOSIS — J069 Acute upper respiratory infection, unspecified: Secondary | ICD-10-CM | POA: Diagnosis not present

## 2021-03-17 DIAGNOSIS — F39 Unspecified mood [affective] disorder: Secondary | ICD-10-CM | POA: Diagnosis not present

## 2021-03-17 DIAGNOSIS — E782 Mixed hyperlipidemia: Secondary | ICD-10-CM | POA: Diagnosis not present

## 2021-03-17 DIAGNOSIS — B373 Candidiasis of vulva and vagina: Secondary | ICD-10-CM | POA: Diagnosis not present

## 2021-04-08 DIAGNOSIS — Z419 Encounter for procedure for purposes other than remedying health state, unspecified: Secondary | ICD-10-CM | POA: Diagnosis not present

## 2021-04-14 DIAGNOSIS — Z6832 Body mass index (BMI) 32.0-32.9, adult: Secondary | ICD-10-CM | POA: Diagnosis not present

## 2021-04-14 DIAGNOSIS — J069 Acute upper respiratory infection, unspecified: Secondary | ICD-10-CM | POA: Diagnosis not present

## 2021-04-22 ENCOUNTER — Ambulatory Visit (INDEPENDENT_AMBULATORY_CARE_PROVIDER_SITE_OTHER): Payer: Medicaid Other

## 2021-04-22 ENCOUNTER — Other Ambulatory Visit: Payer: Self-pay

## 2021-04-22 DIAGNOSIS — Q8740 Marfan's syndrome, unspecified: Secondary | ICD-10-CM | POA: Diagnosis not present

## 2021-04-22 LAB — ECHOCARDIOGRAM COMPLETE
AR max vel: 2.35 cm2
AV Area VTI: 2.68 cm2
AV Area mean vel: 2.39 cm2
AV Mean grad: 2 mmHg
AV Peak grad: 4.2 mmHg
Ao pk vel: 1.03 m/s
Area-P 1/2: 3.66 cm2
S' Lateral: 2.3 cm

## 2021-04-22 MED ORDER — PERFLUTREN LIPID MICROSPHERE
1.0000 mL | INTRAVENOUS | Status: AC | PRN
  Administered 2021-04-22: 2 mL via INTRAVENOUS

## 2021-04-27 ENCOUNTER — Other Ambulatory Visit: Payer: Self-pay

## 2021-04-27 ENCOUNTER — Ambulatory Visit (INDEPENDENT_AMBULATORY_CARE_PROVIDER_SITE_OTHER): Payer: Medicaid Other | Admitting: Cardiology

## 2021-04-27 ENCOUNTER — Encounter: Payer: Self-pay | Admitting: Cardiology

## 2021-04-27 VITALS — BP 112/62 | HR 98 | Ht 64.0 in | Wt 198.0 lb

## 2021-04-27 DIAGNOSIS — Q8740 Marfan's syndrome, unspecified: Secondary | ICD-10-CM

## 2021-04-27 DIAGNOSIS — I10 Essential (primary) hypertension: Secondary | ICD-10-CM | POA: Diagnosis not present

## 2021-04-27 DIAGNOSIS — E7800 Pure hypercholesterolemia, unspecified: Secondary | ICD-10-CM | POA: Diagnosis not present

## 2021-04-27 NOTE — Patient Instructions (Signed)
Medication Instructions:  Your physician recommends that you continue on your current medications as directed. Please refer to the Current Medication list given to you today.  *If you need a refill on your cardiac medications before your next appointment, please call your pharmacy*   Lab Work: None ordered If you have labs (blood work) drawn today and your tests are completely normal, you will receive your results only by: [place] (if you have MyChart) OR A paper copy in the mail If you have any lab test that is abnormal or we need to change your treatment, we will call you to review the results.   Testing/Procedures: Your physician has requested that you have an echocardiogram in 1 year. Echocardiography is a painless test that uses sound waves to create images of your heart. It provides your doctor with information about the size and shape of your heart and how well your heart's chambers and valves are working. This procedure takes approximately one hour. There are no restrictions for this procedure.    Follow-Up: At [facility], you and your health needs are our priority.  As part of our continuing mission to provide you with exceptional heart care, we have created designated Provider Care Teams.  These Care Teams include your primary Cardiologist (physician) and Advanced Practice Providers (APPs -  Physician Assistants and Nurse Practitioners) who all work together to provide you with the care you need, when you need it.  We recommend signing up for the patient portal called "MyChart".  Sign up information is provided on this After Visit Summary.  MyChart is used to connect with patients for Virtual Visits (Telemedicine).  Patients are able to view lab/test results, encounter notes, upcoming appointments, etc.  Non-urgent messages can be sent to your provider as well.   To learn more about what you can do with MyChart, go to [email or web address].    Your next appointment:    1 year(s)  The format for your next appointment:   In Person  Provider:   Kate Sable, MD   Other Instructions

## 2021-04-27 NOTE — Progress Notes (Signed)
Cardiology Office Note:    Date:  04/27/2021   ID:  Natalie [name], DOB 1975-12-30, MRN [id number]  PCP:  Rubie Maid, MD   [place] Providers Cardiologist:  None     Referring MD: Rubie Maid, MD   Chief Complaint  Patient presents with   Other    Follow up post ECHO. Meds reviewed verbally with patient.     History of Present Illness:    Natalie [name] is a 45 y.o. female with a hx of hypertension, hyperlipidemia, Marfan syndrome who presents for follow-up.  She was last seen to establish care due to history of Marfan's.  History of Marfan's in her father, also father has history of aortic dilatation needing surgery in his 30s.  Echocardiogram was ordered to evaluate any significant structural abnormalities inpatient.  She denies any chest pain, shortness of breath.  Performs all activities of daily living without any adverse effects.  Takes blood pressure medications as prescribed.  BP is well controlled on current medications.  Presents for echo results.  Past Medical History:  Diagnosis Date   Bacterial vaginosis    Dysplasia of cervix, low grade (CIN 1) 02/15/2017   Will need repeat pap smear in 1 year.    History of Graves' disease    Hyperlipemia    Hypertension    Marfan syndrome    Ovarian cyst    Vitamin D deficiency     Past Surgical History:  Procedure Laterality Date   DILATION AND CURETTAGE OF UTERUS     TONSILECTOMY/ADENOIDECTOMY WITH MYRINGOTOMY     TUBAL LIGATION      Current Medications: Current Meds  Medication Sig   atorvastatin (LIPITOR) 40 MG tablet Take by mouth.   buPROPion (WELLBUTRIN XL) 150 MG 24 hr tablet Take 1 tablet by mouth every morning.   busPIRone (BUSPAR) 10 MG tablet Take 10 mg by mouth 3 (three) times daily as needed.   butalbital-acetaminophen-caffeine (FIORICET) 50-325-40 MG tablet SMARTSIG:1-3 Tablet(s) By Mouth Every 4 Hours PRN   Cholecalciferol (VITAMIN D3) 1.25 MG (50000 UT) CAPS Take 1 capsule by mouth once  a week.    DULoxetine (CYMBALTA) 60 MG capsule Take 60 mg by mouth daily.   fesoterodine (TOVIAZ) 8 MG TB24 tablet Take 1 tablet (8 mg total) by mouth daily.   levothyroxine (SYNTHROID) 50 MCG tablet Take 50 mcg by mouth daily before breakfast.   nitrofurantoin, macrocrystal-monohydrate, (MACROBID) 100 MG capsule Take 100 mg by mouth 2 (two) times daily.   pantoprazole (PROTONIX) 40 MG tablet Take 40 mg by mouth daily.   Probiotic Product (PROBIOTIC DAILY PO) Take by mouth.   triamterene-hydrochlorothiazide (MAXZIDE-25) 37.5-25 MG tablet Take 1 tablet by mouth daily.     Allergies:   Hydrocodone-acetaminophen and Oxycodone-acetaminophen   Social History   Socioeconomic History   Marital status: Married    Spouse name: Not on file   Number of children: Not on file   Years of education: Not on file   Highest education level: Not on file  Occupational History   Not on file  Tobacco Use   Smoking status: Former    Packs/day: 0.50    Years: 10.00    Pack years: 5.00    Types: Cigarettes    Quit date: 12/12/2016    Years since quitting: 4.3   Smokeless tobacco: Never  Vaping Use   Vaping Use: Never used  Substance and Sexual Activity   Alcohol use: Not Currently    Alcohol/week: 0.0 standard drinks  Drug use: No   Sexual activity: Yes    Birth control/protection: None  Other Topics Concern   Not on file  Social History Narrative   Not on file   Social Determinants of Health   Financial Resource Strain: Not on file  Food Insecurity: Not on file  Transportation Needs: Not on file  Physical Activity: Not on file  Stress: Not on file  Social Connections: Not on file     Family History: The patient's family history includes Breast cancer in her paternal grandmother; Depression in her brother and sister; Hyperlipidemia in her mother; Hyperthyroidism in her mother; Marfan syndrome in her father and sister; Thyroid cancer in her father.  ROS:   Please see the history of  present illness.     All other systems reviewed and are negative.  EKGs/Labs/Other Studies Reviewed:    The following studies were reviewed today:   EKG:  EKG not  ordered today.   Recent Labs: No results found for requested labs within last 8760 hours.  Recent Lipid Panel No results found for: CHOL, TRIG, HDL, CHOLHDL, VLDL, LDLCALC, LDLDIRECT   Risk Assessment/Calculations:      Physical Exam:    VS:  BP 112/62 (BP Location: Left Arm, Patient Position: Sitting, Cuff Size: Normal)   Pulse 98   Ht 5' 4"  (1.626 m)   Wt 198 lb (89.8 kg)   SpO2 98%   BMI 33.99 kg/m     Wt Readings from Last 3 Encounters:  04/27/21 198 lb (89.8 kg)  03/16/21 195 lb (88.5 kg)  12/17/20 202 lb 9 oz (91.9 kg)     GEN:  Well nourished, well developed in no acute distress HEENT: Normal NECK: No JVD; No carotid bruits LYMPHATICS: No lymphadenopathy CARDIAC: RRR, no murmurs, rubs, gallops RESPIRATORY:  Clear to auscultation without rales, wheezing or rhonchi  ABDOMEN: Soft, non-tender, non-distended MUSCULOSKELETAL:  No edema; No deformity  SKIN: Warm and dry NEUROLOGIC:  Alert and oriented x 3 PSYCHIATRIC:  Normal affect   ASSESSMENT:    1. Marfan's syndrome   2. Primary hypertension   3. Pure hypercholesterolemia     PLAN:    In order of problems listed above:  Patient with history of Marfan syndrome.  Aortic dilatation and surgery in her dad around age 36s.  Echocardiogram showed normal systolic and diastolic function, normal aortic root and ascending aortic diameter, no structural or valvular abnormalities noted.  Patient made aware of results and reassured.  Repeat echo in 1 year to confirm stability, will consider as needed after. Hypertension, BP controlled, continue Maxzide. Hyperlipidemia, continue Lipitor.  Follow-up in 1 year after echocardiogram     Medication Adjustments/Labs and Tests Ordered: Current medicines are reviewed at length with the patient today.   Concerns regarding medicines are outlined above.  Orders Placed This Encounter  Procedures   ECHOCARDIOGRAM COMPLETE    No orders of the defined types were placed in this encounter.   Patient Instructions  Medication Instructions:  Your physician recommends that you continue on your current medications as directed. Please refer to the Current Medication list given to you today.  *If you need a refill on your cardiac medications before your next appointment, please call your pharmacy*   Lab Work: None ordered If you have labs (blood work) drawn today and your tests are completely normal, you will receive your results only by: [place] (if you have MyChart) OR A paper copy in the mail If you have any lab  test that is abnormal or we need to change your treatment, we will call you to review the results.   Testing/Procedures: Your physician has requested that you have an echocardiogram in 1 year. Echocardiography is a painless test that uses sound waves to create images of your heart. It provides your doctor with information about the size and shape of your heart and how well your heart's chambers and valves are working. This procedure takes approximately one hour. There are no restrictions for this procedure.    Follow-Up: At [facility], you and your health needs are our priority.  As part of our continuing mission to provide you with exceptional heart care, we have created designated Provider Care Teams.  These Care Teams include your primary Cardiologist (physician) and Advanced Practice Providers (APPs -  Physician Assistants and Nurse Practitioners) who all work together to provide you with the care you need, when you need it.  We recommend signing up for the patient portal called "MyChart".  Sign up information is provided on this After Visit Summary.  MyChart is used to connect with patients for Virtual Visits (Telemedicine).  Patients are able to view lab/test results,  encounter notes, upcoming appointments, etc.  Non-urgent messages can be sent to your provider as well.   To learn more about what you can do with MyChart, go to [email or web address].    Your next appointment:   1 year(s)  The format for your next appointment:   In Person  Provider:   Kate Sable, MD   Other Instructions    Signed, Kate Sable, MD  04/27/2021 5:07 PM    [place]

## 2021-04-28 ENCOUNTER — Telehealth: Payer: Self-pay

## 2021-04-28 NOTE — Telephone Encounter (Signed)
Opened in error

## 2021-05-08 DIAGNOSIS — Z419 Encounter for procedure for purposes other than remedying health state, unspecified: Secondary | ICD-10-CM | POA: Diagnosis not present

## 2021-05-13 ENCOUNTER — Encounter: Payer: Medicaid Other | Admitting: Obstetrics and Gynecology

## 2021-05-15 DIAGNOSIS — Z6832 Body mass index (BMI) 32.0-32.9, adult: Secondary | ICD-10-CM | POA: Diagnosis not present

## 2021-05-15 DIAGNOSIS — E782 Mixed hyperlipidemia: Secondary | ICD-10-CM | POA: Diagnosis not present

## 2021-05-15 DIAGNOSIS — Z9109 Other allergy status, other than to drugs and biological substances: Secondary | ICD-10-CM | POA: Diagnosis not present

## 2021-05-15 DIAGNOSIS — F419 Anxiety disorder, unspecified: Secondary | ICD-10-CM | POA: Diagnosis not present

## 2021-05-26 DIAGNOSIS — R42 Dizziness and giddiness: Secondary | ICD-10-CM | POA: Diagnosis not present

## 2021-05-26 NOTE — Patient Instructions (Signed)
https://www.womenshealth.gov/menopause/menopause-basics"> https://www.clinicalkey.com">  Menopause Menopause is the normal time of a woman's life when menstrual periods stop completely. It marks the natural end to a woman's ability to become pregnant. It can be defined as the absence of a menstrual period for 12 months without another medical cause. The transition to menopause (perimenopause) most often happens between the ages of 53 and 40, and can last for many years. During perimenopause, hormone levels change in your body, which can cause symptoms and affect your health. Menopause may increase your risk for: Weakened bones (osteoporosis), which causes fractures. Depression. Hardening and narrowing of the arteries (atherosclerosis), which can cause heart attacks and strokes. What are the causes? This condition is usually caused by a natural change in hormone levels that happens as you get older. The condition may also be caused by changes that are not natural, including: Surgery to remove both ovaries (surgical menopause). Side effects from some medicines, such as chemotherapy used to treat cancer (chemical menopause). What increases the risk? This condition is more likely to start at an earlier age if you have certain medical conditions or have undergone treatments, including: A tumor of the pituitary gland in the brain. A disease that affects the ovaries and hormones. Certain cancer treatments, such as chemotherapy or hormone therapy, or radiation therapy on the pelvis. Heavy smoking and excessive alcohol use. Family history of early menopause. This condition is also more likely to develop earlier in women who are verythin. What are the signs or symptoms? Symptoms of this condition include: Hot flashes. Irregular menstrual periods. Night sweats. Changes in feelings about sex. This could be a decrease in sex drive or an increased discomfort around your sexuality. Vaginal dryness and  thinning of the vaginal walls. This may cause painful sex. Dryness of the skin and development of wrinkles. Headaches. Problems sleeping (insomnia). Mood swings or irritability. Memory problems. Weight gain. Hair growth on the face and chest. Bladder infections or problems with urinating. How is this diagnosed? This condition is diagnosed based on your medical history, a physical exam, your age, your menstrual history, and your symptoms. Hormone tests may also bedone. How is this treated? In some cases, no treatment is needed. You and your health care provider should make a decision together about whether treatment is necessary. Treatment will be based on your individual condition and preferences. Treatment for this condition focuses on managing symptoms. Treatment may include: Menopausal hormone therapy (MHT). Medicines to treat specific symptoms or complications. Acupuncture. Vitamin or herbal supplements. Before starting treatment, make sure to let your health care provider know if you have a personal or family history of these conditions: Heart disease. Breast cancer. Blood clots. Diabetes. Osteoporosis. Follow these instructions at home: Lifestyle Do not use any products that contain nicotine or tobacco, such as cigarettes, e-cigarettes, and chewing tobacco. If you need help quitting, ask your health care provider. Get at least 30 minutes of physical activity on 5 or more days each week. Avoid alcoholic and caffeinated beverages, as well as spicy foods. This may help prevent hot flashes. Get 7-8 hours of sleep each night. If you have hot flashes, try: Dressing in layers. Avoiding things that may trigger hot flashes, such as spicy food, warm places, or stress. Taking slow, deep breaths when a hot flash starts. Keeping a fan in your home and office. Find ways to manage stress, such as deep breathing, meditation, or journaling. Consider going to group therapy with other women who  are having menopause symptoms. Ask your  health care provider about recommended group therapy meetings. Eating and drinking  Eat a healthy, balanced diet that contains whole grains, lean protein, low-fat dairy, and plenty of fruits and vegetables. Your health care provider may recommend adding more soy to your diet. Foods that contain soy include tofu, tempeh, and soy milk. Eat plenty of foods that contain calcium and vitamin D for bone health. Items that are rich in calcium include low-fat milk, yogurt, beans, almonds, sardines, broccoli, and kale.  Medicines Take over-the-counter and prescription medicines only as told by your health care provider. Talk with your health care provider before starting any herbal supplements. If prescribed, take vitamins and supplements as told by your health care provider. General instructions  Keep track of your menstrual periods, including: When they occur. How heavy they are and how long they last. How much time passes between periods. Keep track of your symptoms, noting when they start, how often you have them, and how long they last. Use vaginal lubricants or moisturizers to help with vaginal dryness and improve comfort during sex. Keep all follow-up visits. This is important. This includes any group therapy or counseling.  Contact a health care provider if: You are still having menstrual periods after age 98. You have pain during sex. You have not had a period for 12 months and you develop vaginal bleeding. Get help right away if you have: Severe depression. Excessive vaginal bleeding. Pain when you urinate. A fast or irregular heartbeat (palpitations). Severe headaches. Abdominal pain or severe indigestion. Summary Menopause is a normal time of life when menstrual periods stop completely. It is usually defined as the absence of a menstrual period for 12 months without another medical cause. The transition to menopause (perimenopause) most often  happens between the ages of 45 and 94 and can last for several years. Symptoms can be managed through medicines, lifestyle changes, and complementary therapies such as acupuncture. Eat a balanced diet that is rich in nutrients to promote bone health and heart health and to manage symptoms during menopause. This information is not intended to replace advice given to you by your health care provider. Make sure you discuss any questions you have with your healthcare provider. Document Revised: 07/25/2020 Document Reviewed: 04/10/2020 Elsevier Patient Education  [place].

## 2021-05-26 NOTE — Progress Notes (Signed)
    GYNECOLOGY PROGRESS NOTE  Subjective:    Patient ID: Natalie [name], female    DOB: Jun 27, 1976, 45 y.o.   MRN: [id number]  HPI  Patient is a 45 y.o. [id number] female who presents for Menopausal Symptoms. Her LMP was around 04/15/2021 and it only lasted one day, before that she has not had a cycle for about 1-2 years. She has chills and sweats, constantly hot and has fatigue along with mild mood swings.  Has tried hormonal therapy before in the past (which made her resume cycles when she was perimenopausal). However labs in 2020 confirm she is now menopausal.   Patient also has questions about what she can take for weight loss over the counter.   The following portions of the patient's history were reviewed and updated as appropriate: allergies, current medications, past family history, past medical history, past social history, past surgical history, and problem list.  Review of Systems Pertinent items noted in HPI and remainder of comprehensive ROS otherwise negative.   Objective:   Blood pressure 118/84, pulse 96, resp. rate 16, height 5' 4"  (1.626 m), weight 195 lb 9.6 oz (88.7 kg), last menstrual period 04/15/2021. Body mass index is 33.57 kg/m. General appearance: alert and no distress Remainder of exam deferred.    Assessment:   1. Menopausal vasomotor syndrome   2. Obesity (BMI 30.0-34.9)      Plan:   1. Menopausal vasomotor syndrome Patient with bothersome menopausal vasomotor symptoms. Discussed lifestyle interventions such as wearing light clothing, remaining in cool environments, having fan/air conditioner in the room, avoiding hot beverages etc.  Discussed using hormone therapy and concerns about increased risk of heart disease, cerebrovascular disease, thromboembolic disease,  and breast cancer.  Also discussed other medical options such as Paxil, Effexor, Brisdelle, Clonidine,  or Neurontin.   Also discussed alternative therapies such as herbal remedies but cautioned  that most of the products contained phytoestrogens (plant estrogens) in unregulated amounts which can have the same effects on the body as the pharmaceutical estrogen preparations.  Also referred her to www.menopause.org for other alternative options.  Patient opted for natural remedies, given a list of commonly utilized options.  She will follow up if symptoms not controlled.    2. Obesity (BMI 30.0-34.9) Patient with mild obesity, desires info on OTC weight loss options. Disccussed herbal remedies such as green tea or coffee bean extract, ginkgo balboa, apple cider vinegar supplements. Can also utilize Alli.  Patient notes that her PCP recommended that she not try stimulant weight loss medications due to her h/o anxiety.    A total of 15 minutes were spent face-to-face with the patient during this encounter and over half of that time dealt with counseling and coordination of care.  Rubie Maid, MD [facility]

## 2021-05-27 ENCOUNTER — Other Ambulatory Visit: Payer: Self-pay

## 2021-05-27 ENCOUNTER — Ambulatory Visit (INDEPENDENT_AMBULATORY_CARE_PROVIDER_SITE_OTHER): Payer: Medicaid Other | Admitting: Obstetrics and Gynecology

## 2021-05-27 ENCOUNTER — Encounter: Payer: Self-pay | Admitting: Obstetrics and Gynecology

## 2021-05-27 VITALS — BP 118/84 | HR 96 | Resp 16 | Ht 64.0 in | Wt 195.6 lb

## 2021-05-27 DIAGNOSIS — E669 Obesity, unspecified: Secondary | ICD-10-CM

## 2021-05-27 DIAGNOSIS — N951 Menopausal and female climacteric states: Secondary | ICD-10-CM

## 2021-06-01 DIAGNOSIS — F411 Generalized anxiety disorder: Secondary | ICD-10-CM | POA: Diagnosis not present

## 2021-06-01 DIAGNOSIS — R300 Dysuria: Secondary | ICD-10-CM | POA: Diagnosis not present

## 2021-06-01 DIAGNOSIS — Z6833 Body mass index (BMI) 33.0-33.9, adult: Secondary | ICD-10-CM | POA: Diagnosis not present

## 2021-06-01 DIAGNOSIS — R109 Unspecified abdominal pain: Secondary | ICD-10-CM | POA: Diagnosis not present

## 2021-06-08 DIAGNOSIS — Z419 Encounter for procedure for purposes other than remedying health state, unspecified: Secondary | ICD-10-CM | POA: Diagnosis not present

## 2021-06-09 ENCOUNTER — Encounter: Payer: Medicaid Other | Admitting: Obstetrics and Gynecology

## 2021-06-26 DIAGNOSIS — Z6833 Body mass index (BMI) 33.0-33.9, adult: Secondary | ICD-10-CM | POA: Diagnosis not present

## 2021-06-26 DIAGNOSIS — R300 Dysuria: Secondary | ICD-10-CM | POA: Diagnosis not present

## 2021-06-29 ENCOUNTER — Telehealth: Payer: Self-pay | Admitting: Urology

## 2021-06-29 MED ORDER — FESOTERODINE FUMARATE ER 8 MG PO TB24: 8.0000 mg | ORAL_TABLET | Freq: Every day | ORAL | 11 refills | Status: DC

## 2021-06-29 NOTE — Telephone Encounter (Signed)
Medication refilled

## 2021-06-29 NOTE — Telephone Encounter (Signed)
Patient is requesting a med refill on Toviaz.

## 2021-07-01 DIAGNOSIS — R42 Dizziness and giddiness: Secondary | ICD-10-CM | POA: Diagnosis not present

## 2021-07-03 DIAGNOSIS — J069 Acute upper respiratory infection, unspecified: Secondary | ICD-10-CM | POA: Diagnosis not present

## 2021-07-06 ENCOUNTER — Ambulatory Visit: Payer: Self-pay | Admitting: Urology

## 2021-07-09 DIAGNOSIS — Z419 Encounter for procedure for purposes other than remedying health state, unspecified: Secondary | ICD-10-CM | POA: Diagnosis not present

## 2021-07-24 DIAGNOSIS — B373 Candidiasis of vulva and vagina: Secondary | ICD-10-CM | POA: Diagnosis not present

## 2021-07-24 DIAGNOSIS — Z23 Encounter for immunization: Secondary | ICD-10-CM | POA: Diagnosis not present

## 2021-07-24 DIAGNOSIS — M545 Low back pain, unspecified: Secondary | ICD-10-CM | POA: Diagnosis not present

## 2021-07-24 DIAGNOSIS — N951 Menopausal and female climacteric states: Secondary | ICD-10-CM | POA: Diagnosis not present

## 2021-07-24 DIAGNOSIS — F411 Generalized anxiety disorder: Secondary | ICD-10-CM | POA: Diagnosis not present

## 2021-08-03 ENCOUNTER — Other Ambulatory Visit: Payer: Self-pay

## 2021-08-03 ENCOUNTER — Ambulatory Visit (INDEPENDENT_AMBULATORY_CARE_PROVIDER_SITE_OTHER): Payer: Medicaid Other | Admitting: Urology

## 2021-08-03 VITALS — BP 135/85 | HR 97

## 2021-08-03 DIAGNOSIS — N3020 Other chronic cystitis without hematuria: Secondary | ICD-10-CM | POA: Diagnosis not present

## 2021-08-03 DIAGNOSIS — N3946 Mixed incontinence: Secondary | ICD-10-CM | POA: Diagnosis not present

## 2021-08-03 LAB — URINALYSIS, COMPLETE
Bilirubin, UA: NEGATIVE
Glucose, UA: NEGATIVE
Ketones, UA: NEGATIVE
Leukocytes,UA: NEGATIVE
Nitrite, UA: NEGATIVE
Protein,UA: NEGATIVE
RBC, UA: NEGATIVE
Specific Gravity, UA: 1.01 (ref 1.005–1.030)
Urobilinogen, Ur: 0.2 mg/dL (ref 0.2–1.0)
pH, UA: 6 (ref 5.0–7.5)

## 2021-08-03 LAB — MICROSCOPIC EXAMINATION

## 2021-08-03 MED ORDER — FESOTERODINE FUMARATE ER 8 MG PO TB24: 8.0000 mg | ORAL_TABLET | Freq: Every day | ORAL | 3 refills | Status: DC

## 2021-08-03 MED ORDER — NITROFURANTOIN MONOHYD MACRO 100 MG PO CAPS: 100.0000 mg | ORAL_CAPSULE | Freq: Every day | ORAL | 3 refills | Status: DC

## 2021-08-03 NOTE — Progress Notes (Signed)
08/03/2021 3:37 PM   Natalie [name] 08-10-1976 [id number]  Referring provider: Lorelee [name], [place],  [place] [id number]  Chief Complaint  Patient presents with   Follow-up    HPI: I reviewed lengthy note.  Last time I did not think she had interstitial cystitis.  We should daily Macrodantin.  Incontinence greatly improved on Toviaz.  Normal cystoscopy last visit.  [name] failed Vesicare and oxybutynin and Myrbetriq was expensive  Today No infections.  Doing great with reduced frequency and urge incontinence on Toviaz.  Little bit of burning.   PMH: Past Medical History:  Diagnosis Date   Bacterial vaginosis    Dysplasia of cervix, low grade (CIN 1) 02/15/2017   Will need repeat pap smear in 1 year.    Hyperlipemia    Hypertension    Hypothyroidism (acquired)    Marfan syndrome    Ovarian cyst    Vitamin D deficiency     Surgical History: Past Surgical History:  Procedure Laterality Date   DILATION AND CURETTAGE OF UTERUS     TONSILECTOMY/ADENOIDECTOMY WITH MYRINGOTOMY     TUBAL LIGATION      Home Medications:  Allergies as of 08/03/2021       Reactions   Hydrocodone-acetaminophen Itching   Oxycodone-acetaminophen Other (See Comments), Itching        Medication List        Accurate as of August 03, 2021  3:37 PM. If you have any questions, ask your nurse or doctor.          STOP taking these medications    DULoxetine 20 MG capsule Commonly known as: CYMBALTA Stopped by: Natalie Packer, MD       TAKE these medications    atorvastatin 20 MG tablet Commonly known as: LIPITOR Take 20 mg by mouth daily.   buPROPion 150 MG 24 hr tablet Commonly known as: WELLBUTRIN XL Take 1 tablet by mouth every morning.   busPIRone 10 MG tablet Commonly known as: BUSPAR Take 10 mg by mouth 3 (three) times daily as needed.   butalbital-acetaminophen-caffeine 50-325-40 MG tablet Commonly known as: FIORICET SMARTSIG:1-3  Tablet(s) By Mouth Every 4 Hours PRN   escitalopram 10 MG tablet Commonly known as: LEXAPRO Take 10 mg by mouth daily.   fesoterodine 8 MG Tb24 tablet Commonly known as: TOVIAZ Take 1 tablet (8 mg total) by mouth daily.   fluticasone 50 MCG/ACT nasal spray Commonly known as: FLONASE SMARTSIG:1-2 Spray(s) Both Nares Daily PRN   levothyroxine 50 MCG tablet Commonly known as: SYNTHROID Take 50 mcg by mouth daily before breakfast.   loratadine 10 MG tablet Commonly known as: CLARITIN Take 10 mg by mouth daily.   montelukast 10 MG tablet Commonly known as: SINGULAIR SMARTSIG:1 Tablet(s) By Mouth Every Evening   nitrofurantoin (macrocrystal-monohydrate) 100 MG capsule Commonly known as: MACROBID Take 100 mg by mouth daily.   pantoprazole 40 MG tablet Commonly known as: PROTONIX Take 40 mg by mouth daily.   PROBIOTIC DAILY PO Take by mouth.   traZODone 150 MG tablet Commonly known as: DESYREL Take 150 mg by mouth at bedtime.   triamterene-hydrochlorothiazide 37.5-25 MG tablet Commonly known as: MAXZIDE-25 Take 1 tablet by mouth daily.   Vitamin D3 1.25 MG (50000 UT) Caps Take 1 capsule by mouth once a week.        Allergies:  Allergies  Allergen Reactions   Hydrocodone-Acetaminophen Itching   Oxycodone-Acetaminophen Other (See Comments) and Itching    Family History:  Family History  Problem Relation Age of Onset   Hyperlipidemia Mother    Hyperthyroidism Mother    Thyroid cancer Father    Marfan syndrome Father    Marfan syndrome Sister    Depression Sister    Depression Brother    Breast cancer Paternal Grandmother        great PGM    Social History:  reports that she quit smoking about 4 years ago. Her smoking use included cigarettes. [name] a 5.00 pack-year smoking history. [name] never used smokeless tobacco. She reports that she does not currently use alcohol. She reports that she does not use drugs.  ROS:                                         Physical Exam: BP 135/85   Pulse 97   Constitutional:  Alert and oriented, No acute distress. HEENT: [place] AT, moist mucus membranes.  Trachea midline, no masses. Cardiovascular: No clubbing, cyanosis, or edema.   Laboratory Data: No results found for: WBC, HGB, HCT, MCV, PLT  No results found for: CREATININE  No results found for: PSA  No results found for: TESTOSTERONE  No results found for: HGBA1C  Urinalysis    Component Value Date/Time   COLORURINE Yellow 10/09/2013 0859   APPEARANCEUR Clear 09/15/2020 1019   LABSPEC 1.006 10/09/2013 0859   PHURINE 6.0 10/09/2013 0859   GLUCOSEU Negative 09/15/2020 1019   GLUCOSEU Negative 10/09/2013 0859   HGBUR Negative 10/09/2013 0859   BILIRUBINUR Negative 09/15/2020 1019   BILIRUBINUR Negative 10/09/2013 0859   KETONESUR Negative 10/09/2013 0859   PROTEINUR Negative 09/15/2020 1019   PROTEINUR Negative 10/09/2013 0859   UROBILINOGEN 0.2 05/07/2020 1122   NITRITE Negative 09/15/2020 1019   NITRITE Negative 10/09/2013 0859   LEUKOCYTESUR Negative 09/15/2020 1019   LEUKOCYTESUR Negative 10/09/2013 0859    Pertinent Imaging:   Assessment & Plan: Both prescriptions 90x3 sent to pharmacy.  Call if urine culture positive I will see in 1 year  There are no diagnoses linked to this encounter.  No follow-ups on file.  Natalie Packer, MD  [facility] [address], [place],  [address] [phone]

## 2021-08-07 LAB — CULTURE, URINE COMPREHENSIVE

## 2021-08-08 DIAGNOSIS — Z419 Encounter for procedure for purposes other than remedying health state, unspecified: Secondary | ICD-10-CM | POA: Diagnosis not present

## 2021-08-13 DIAGNOSIS — R42 Dizziness and giddiness: Secondary | ICD-10-CM | POA: Diagnosis not present

## 2021-08-13 DIAGNOSIS — G4489 Other headache syndrome: Secondary | ICD-10-CM | POA: Diagnosis not present

## 2021-08-13 DIAGNOSIS — R251 Tremor, unspecified: Secondary | ICD-10-CM | POA: Diagnosis not present

## 2021-08-13 DIAGNOSIS — G44219 Episodic tension-type headache, not intractable: Secondary | ICD-10-CM | POA: Diagnosis not present

## 2021-08-19 DIAGNOSIS — E782 Mixed hyperlipidemia: Secondary | ICD-10-CM | POA: Diagnosis not present

## 2021-08-19 DIAGNOSIS — E039 Hypothyroidism, unspecified: Secondary | ICD-10-CM | POA: Diagnosis not present

## 2021-08-19 DIAGNOSIS — I10 Essential (primary) hypertension: Secondary | ICD-10-CM | POA: Diagnosis not present

## 2021-09-01 ENCOUNTER — Ambulatory Visit: Payer: Medicaid Other | Admitting: Physical Therapy

## 2021-09-02 ENCOUNTER — Other Ambulatory Visit: Payer: Self-pay | Admitting: Physician Assistant

## 2021-09-02 DIAGNOSIS — R519 Headache, unspecified: Secondary | ICD-10-CM

## 2021-09-02 DIAGNOSIS — G44219 Episodic tension-type headache, not intractable: Secondary | ICD-10-CM

## 2021-09-02 DIAGNOSIS — R42 Dizziness and giddiness: Secondary | ICD-10-CM

## 2021-09-02 DIAGNOSIS — R251 Tremor, unspecified: Secondary | ICD-10-CM

## 2021-09-08 DIAGNOSIS — Z419 Encounter for procedure for purposes other than remedying health state, unspecified: Secondary | ICD-10-CM | POA: Diagnosis not present

## 2021-09-09 ENCOUNTER — Ambulatory Visit
Admission: RE | Admit: 2021-09-09 | Discharge: 2021-09-09 | Disposition: A | Discharge status: Home / Self Care | Payer: Medicaid Other | Source: Ambulatory Visit | Attending: Physician Assistant | Admitting: Physician Assistant

## 2021-09-09 ENCOUNTER — Other Ambulatory Visit: Payer: Self-pay

## 2021-09-09 DIAGNOSIS — R519 Headache, unspecified: Secondary | ICD-10-CM | POA: Diagnosis not present

## 2021-09-09 DIAGNOSIS — M67432 Ganglion, left wrist: Secondary | ICD-10-CM | POA: Diagnosis not present

## 2021-09-09 DIAGNOSIS — Z6835 Body mass index (BMI) 35.0-35.9, adult: Secondary | ICD-10-CM | POA: Diagnosis not present

## 2021-09-09 DIAGNOSIS — E669 Obesity, unspecified: Secondary | ICD-10-CM | POA: Diagnosis not present

## 2021-09-09 DIAGNOSIS — R251 Tremor, unspecified: Secondary | ICD-10-CM | POA: Insufficient documentation

## 2021-09-09 DIAGNOSIS — R42 Dizziness and giddiness: Secondary | ICD-10-CM | POA: Insufficient documentation

## 2021-09-09 DIAGNOSIS — G44219 Episodic tension-type headache, not intractable: Secondary | ICD-10-CM | POA: Insufficient documentation

## 2021-09-09 MED ORDER — GADOBUTROL 1 MMOL/ML IV SOLN
8.0000 mL | Freq: Once | INTRAVENOUS | Status: AC | PRN
  Administered 2021-09-09: 8 mL via INTRAVENOUS

## 2021-10-05 DIAGNOSIS — J069 Acute upper respiratory infection, unspecified: Secondary | ICD-10-CM | POA: Diagnosis not present

## 2021-10-05 DIAGNOSIS — R6883 Chills (without fever): Secondary | ICD-10-CM | POA: Diagnosis not present

## 2021-10-05 DIAGNOSIS — Z6834 Body mass index (BMI) 34.0-34.9, adult: Secondary | ICD-10-CM | POA: Diagnosis not present

## 2021-10-08 DIAGNOSIS — Z419 Encounter for procedure for purposes other than remedying health state, unspecified: Secondary | ICD-10-CM | POA: Diagnosis not present

## 2021-10-22 DIAGNOSIS — Z6834 Body mass index (BMI) 34.0-34.9, adult: Secondary | ICD-10-CM | POA: Diagnosis not present

## 2021-10-22 DIAGNOSIS — R102 Pelvic and perineal pain: Secondary | ICD-10-CM | POA: Diagnosis not present

## 2021-10-23 ENCOUNTER — Ambulatory Visit (INDEPENDENT_AMBULATORY_CARE_PROVIDER_SITE_OTHER): Payer: Medicaid Other | Admitting: Obstetrics and Gynecology

## 2021-10-23 ENCOUNTER — Encounter: Payer: Self-pay | Admitting: Obstetrics and Gynecology

## 2021-10-23 ENCOUNTER — Other Ambulatory Visit: Payer: Self-pay

## 2021-10-23 VITALS — BP 116/85 | HR 88 | Resp 16 | Ht 64.5 in | Wt 205.7 lb

## 2021-10-23 DIAGNOSIS — L739 Follicular disorder, unspecified: Secondary | ICD-10-CM

## 2021-10-23 DIAGNOSIS — Z1231 Encounter for screening mammogram for malignant neoplasm of breast: Secondary | ICD-10-CM

## 2021-10-23 DIAGNOSIS — Z1211 Encounter for screening for malignant neoplasm of colon: Secondary | ICD-10-CM | POA: Diagnosis not present

## 2021-10-23 DIAGNOSIS — R1030 Lower abdominal pain, unspecified: Secondary | ICD-10-CM | POA: Diagnosis not present

## 2021-10-23 DIAGNOSIS — Z01419 Encounter for gynecological examination (general) (routine) without abnormal findings: Secondary | ICD-10-CM | POA: Diagnosis not present

## 2021-10-23 NOTE — Progress Notes (Signed)
GYNECOLOGY ANNUAL PHYSICAL EXAM PROGRESS NOTE  Subjective:    Natalie [name] is a 45 y.o. [phone] female who presents for an annual exam. The patient is sexually active. The patient has been on hormone replacement therapy in the past. The patient wears seatbelts: yes. The patient participates in regular exercise: yes, walks on the treadmill and lifts dumbbells sometimes. Has the patient ever been transfused or tattooed?: yes. The patient reports that there is not domestic violence in her life.    The patient has the following complaints today:   Notes possible heat rash on her inner thighs.  Possible ingrown hair noted on left vulva. Notes that these happen fairly frequently.  Has some intermittent lower abdominal pain. Thinks she may  not be drinking enough water as her PCP told her that her urine was very concentrated. Only drinks 1 bottle of water a day. Drinks 2-3 green teas per day.    Gynecologic History:  Menarche age: 62 No LMP recorded. Patient is perimenopausal. Contraception: post menopausal status History of STI's: Denies Last Pap: 04/17/2019. Results were: normal.  Reports remote h/o mildly abnormal pap smear in the past. Last mammogram: 07/18/2020. Results were: normal Last colon screening: never had one.    OB History  Gravida Para Term Preterm AB Living  5 3 2 1 2 2   SAB IAB Ectopic Multiple Live Births  2 0 0 0 3    # Outcome Date GA Lbr Len/2nd Weight Sex Delivery Anes PTL Lv  5 Term         LIV  4 SAB           3 SAB           2 Term         ND  1 Preterm     F Vag-Spont   LIV    Past Medical History:  Diagnosis Date   Bacterial vaginosis    Dysplasia of cervix, low grade (CIN 1) 02/15/2017   Will need repeat pap smear in 1 year.    Hyperlipemia    Hypertension    Hypothyroidism (acquired)    Marfan syndrome    Ovarian cyst    Vitamin D deficiency     Past Surgical History:  Procedure Laterality Date   DILATION AND CURETTAGE OF UTERUS      TONSILECTOMY/ADENOIDECTOMY WITH MYRINGOTOMY     TUBAL LIGATION      Family History  Problem Relation Age of Onset   Hyperlipidemia Mother    Hyperthyroidism Mother    Thyroid cancer Father    Marfan syndrome Father    Marfan syndrome Sister    Depression Sister    Depression Brother    Breast cancer Paternal Grandmother        great PGM    Social History   Socioeconomic History   Marital status: Married    Spouse name: Not on file   Number of children: Not on file   Years of education: Not on file   Highest education level: Not on file  Occupational History   Not on file  Tobacco Use   Smoking status: Former    Packs/day: 0.50    Years: 10.00    Pack years: 5.00    Types: Cigarettes    Quit date: 12/12/2016    Years since quitting: 4.8   Smokeless tobacco: Never  Vaping Use   Vaping Use: Never used  Substance and Sexual Activity   Alcohol use: Not Currently  Alcohol/week: 0.0 standard drinks   Drug use: No   Sexual activity: Yes    Birth control/protection: None  Other Topics Concern   Not on file  Social History Narrative   Not on file   Social Determinants of Health   Financial Resource Strain: Not on file  Food Insecurity: Not on file  Transportation Needs: Not on file  Physical Activity: Not on file  Stress: Not on file  Social Connections: Not on file  Intimate Partner Violence: Not on file    Current Outpatient Medications on File Prior to Visit  Medication Sig Dispense Refill   atorvastatin (LIPITOR) 20 MG tablet Take 20 mg by mouth daily.     buPROPion (WELLBUTRIN XL) 150 MG 24 hr tablet Take 1 tablet by mouth every morning.     busPIRone (BUSPAR) 10 MG tablet Take 10 mg by mouth 3 (three) times daily as needed.     butalbital-acetaminophen-caffeine (FIORICET) 50-325-40 MG tablet SMARTSIG:1-3 Tablet(s) By Mouth Every 4 Hours PRN     Cholecalciferol (VITAMIN D3) 1.25 MG (50000 UT) CAPS Take 1 capsule by mouth once a week.      escitalopram  (LEXAPRO) 10 MG tablet Take 10 mg by mouth daily.     fesoterodine (TOVIAZ) 8 MG TB24 tablet Take 1 tablet (8 mg total) by mouth daily. 90 tablet 3   fluticasone (FLONASE) 50 MCG/ACT nasal spray SMARTSIG:1-2 Spray(s) Both Nares Daily PRN     levothyroxine (SYNTHROID) 50 MCG tablet Take 50 mcg by mouth daily before breakfast.     loratadine (CLARITIN) 10 MG tablet Take 10 mg by mouth daily.     montelukast (SINGULAIR) 10 MG tablet SMARTSIG:1 Tablet(s) By Mouth Every Evening     nitrofurantoin, macrocrystal-monohydrate, (MACROBID) 100 MG capsule Take 1 capsule (100 mg total) by mouth daily. 90 capsule 3   pantoprazole (PROTONIX) 40 MG tablet Take 40 mg by mouth daily.  5   Probiotic Product (PROBIOTIC DAILY PO) Take by mouth.     traZODone (DESYREL) 150 MG tablet Take 150 mg by mouth at bedtime.     triamterene-hydrochlorothiazide (MAXZIDE-25) 37.5-25 MG tablet Take 1 tablet by mouth daily.     No current facility-administered medications on file prior to visit.    Allergies  Allergen Reactions   Hydrocodone-Acetaminophen Itching   Oxycodone-Acetaminophen Other (See Comments) and Itching     Review of Systems Constitutional: negative for chills, fatigue, fevers and sweats Eyes: negative for irritation, redness and visual disturbance Ears, nose, mouth, throat, and face: negative for hearing loss, nasal congestion, snoring and tinnitus Respiratory: negative for asthma, cough, sputum Cardiovascular: negative for chest pain, dyspnea, exertional chest pressure/discomfort, irregular heart beat, palpitations and syncope Gastrointestinal: Positive for abdominal pain (see HPI). Negative for change in bowel habits, nausea and vomiting Genitourinary: negative for abnormal menstrual periods, genital lesions, sexual problems and vaginal discharge, dysuria and urinary incontinence. Positive for possible heat rash and "hair bump". Integument/breast: negative for breast lump, breast tenderness and nipple  discharge Hematologic/lymphatic: negative for bleeding and easy bruising Musculoskeletal:negative for back pain and muscle weakness Neurological: negative for dizziness, headaches, vertigo and weakness Endocrine: negative for diabetic symptoms including polydipsia, polyuria and skin dryness Allergic/Immunologic: negative for hay fever and urticaria      Objective:  Blood pressure 116/85, pulse 88, resp. rate 16, height 5' 4.5" (1.638 m), weight 205 lb 11.2 oz (93.3 kg).  Body mass index is 34.76 kg/m.  General Appearance:    Alert, cooperative, no distress, appears stated  age  Head:    Normocephalic, without obvious abnormality, atraumatic  Eyes:    PERRL, conjunctiva/corneas clear, EOM's intact, both eyes  Ears:    Normal external ear canals, both ears  Nose:   Nares normal, septum midline, mucosa normal, no drainage or sinus tenderness  Throat:   Lips, mucosa, and tongue normal; teeth and gums normal  Neck:   Supple, symmetrical, trachea midline, no adenopathy; thyroid: no enlargement/tenderness/nodules; no carotid bruit or JVD  Back:     Symmetric, no curvature, ROM normal, no CVA tenderness  Lungs:     Clear to auscultation bilaterally, respirations unlabored  Chest Wall:    No tenderness or deformity   Heart:    Regular rate and rhythm, S1 and S2 normal, no murmur, rub or gallop  Breast Exam:    No tenderness, masses, or nipple abnormality  Abdomen:     Soft, non-tender, bowel sounds active all four quadrants, no masses, no organomegaly.    Genitalia:    Pelvic:external genitalia with Right thigh with several small skin tags.  Left vulva with healing area of folliculitis. Vagina without lesions, discharge, or tenderness, rectovaginal septum  normal. Cervix normal in appearance, no cervical motion tenderness, no adnexal masses or tenderness.  Uterus normal size, shape, mobile, regular contours, nontender.  Rectal:    Normal external sphincter.  No hemorrhoids appreciated. Internal exam  not done.   Extremities:   Extremities normal, atraumatic, no cyanosis or edema  Pulses:   2+ and symmetric all extremities  Skin:   Skin color, texture, turgor normal, no rashes or lesions  Lymph nodes:   Cervical, supraclavicular, and axillary nodes normal  Neurologic:   CNII-XII intact, normal strength, sensation and reflexes throughout   .  Labs:  No results found for: WBC, HGB, HCT, MCV, PLT  No results found for: CREATININE, BUN, NA, K, CL, CO2  No results found for: ALT, AST, GGT, ALKPHOS, BILITOT  No results found for: TSH   Assessment:   1. Encounter for well woman exam with routine gynecological exam   2. Encounter for screening mammogram for malignant neoplasm of breast   3. Colon cancer screening   4. Folliculitis   5. Lower abdominal pain    Plan:  Blood tests: none. Plans to have labs with PCP next week. Breast self exam technique reviewed and patient encouraged to perform self-exam monthly. Contraception: post menopausal status. Discussed healthy lifestyle modifications.  Patient reports that she is starting on an OTC weight loss supplement.  Mammogram ordered.  Pap smear  UTD . Colon screening: discussed options, patient desires Cologuard.  Will order.  COVID vaccination status: COVID series completed and 1 booster.  Declines further boosters as she got very sick.  Flu vaccine: UTD Discussed home management and prevention of further folliculitis episodes.  Lower abdominal pain, possibly urinary due to odor and decreased PO hydration with water. Advised on increasing water intake.  Follow up in 1 year for annual exam  Rubie Maid, MD [facility]

## 2021-10-23 NOTE — Patient Instructions (Addendum)
Preventive Care 21-45 Years Old, Female °Preventive care refers to lifestyle choices and visits with your health care provider that can promote health and wellness. Preventive care visits are also called wellness exams. °What can I expect for my preventive care visit? °Counseling °During your preventive care visit, your health care provider may ask about your: °Medical history, including: °Past medical problems. °Family medical history. °Pregnancy history. °Current health, including: °Menstrual cycle. °Method of birth control. °Emotional well-being. °Home life and relationship well-being. °Sexual activity and sexual health. °Lifestyle, including: °Alcohol, nicotine or tobacco, and drug use. °Access to firearms. °Diet, exercise, and sleep habits. °Work and work environment. °Sunscreen use. °Safety issues such as seatbelt and bike helmet use. °Physical exam °Your health care provider may check your: °Height and weight. These may be used to calculate your BMI (body mass index). BMI is a measurement that tells if you are at a healthy weight. °Waist circumference. This measures the distance around your waistline. This measurement also tells if you are at a healthy weight and may help predict your risk of certain diseases, such as type 2 diabetes and high blood pressure. °Heart rate and blood pressure. °Body temperature. °Skin for abnormal spots. °What immunizations do I need? °Vaccines are usually given at various ages, according to a schedule. Your health care provider will recommend vaccines for you based on your age, medical history, and lifestyle or other factors, such as travel or where you work. °What tests do I need? °Screening °Your health care provider may recommend screening tests for certain conditions. This may include: °Pelvic exam and Pap test. °Lipid and cholesterol levels. °Diabetes screening. This is done by checking your blood sugar (glucose) after you have not eaten for a while (fasting). °Hepatitis B  test. °Hepatitis C test. °HIV (human immunodeficiency virus) test. °STI (sexually transmitted infection) testing, if you are at risk. °BRCA-related cancer screening. This may be done if you have a family history of breast, ovarian, tubal, or peritoneal cancers. °Talk with your health care provider about your test results, treatment options, and if necessary, the need for more tests. °Follow these instructions at home: °Eating and drinking ° °Eat a healthy diet that includes fresh fruits and vegetables, whole grains, lean protein, and low-fat dairy products. °Take vitamin and mineral supplements as recommended by your health care provider. °Do not drink alcohol if: °Your health care provider tells you not to drink. °You are pregnant, may be pregnant, or are planning to become pregnant. °If you drink alcohol: °Limit how much you have to 0-1 drink a day. °Know how much alcohol is in your drink. In the [place] one drink equals one 12 oz bottle of beer (355 mL), one 5 oz glass of wine (148 mL), or one 1½ oz glass of hard liquor (44 mL). °Lifestyle °Brush your teeth every morning and night with fluoride toothpaste. Floss one time each day. °Exercise for at least 30 minutes 5 or more days each week. °Do not use any products that contain nicotine or tobacco. These products include cigarettes, chewing tobacco, and vaping devices, such as e-cigarettes. If you need help quitting, ask your health care provider. °Do not use drugs. °If you are sexually active, practice safe sex. Use a condom or other form of protection to prevent STIs. °If you do not wish to become pregnant, use a form of birth control. If you plan to become pregnant, see your health care provider for a prepregnancy visit. °Find healthy ways to manage stress, such as: °Meditation, yoga,   or listening to music. °Journaling. °Talking to a trusted person. °Spending time with friends and family. °Minimize exposure to UV radiation to reduce your risk of skin  cancer. °Safety °Always wear your seat belt while driving or riding in a vehicle. °Do not drive: °If you have been drinking alcohol. Do not ride with someone who has been drinking. °If you have been using any mind-altering substances or drugs. °While texting. °When you are tired or distracted. °Wear a helmet and other protective equipment during sports activities. °If you have firearms in your house, make sure you follow all gun safety procedures. °Seek help if you have been physically or sexually abused. °What's next? °Go to your health care provider once a year for an annual wellness visit. °Ask your health care provider how often you should have your eyes and teeth checked. °Stay up to date on all vaccines. °This information is not intended to replace advice given to you by your health care provider. Make sure you discuss any questions you have with your health care provider. °Document Revised: 04/22/2021 Document Reviewed: 04/22/2021 °Elsevier Patient Education © 2022 [employer] Inc. ° °

## 2021-11-08 DIAGNOSIS — Z419 Encounter for procedure for purposes other than remedying health state, unspecified: Secondary | ICD-10-CM | POA: Diagnosis not present

## 2021-11-22 DIAGNOSIS — Z1211 Encounter for screening for malignant neoplasm of colon: Secondary | ICD-10-CM | POA: Diagnosis not present

## 2021-11-26 DIAGNOSIS — E039 Hypothyroidism, unspecified: Secondary | ICD-10-CM | POA: Diagnosis not present

## 2021-11-26 DIAGNOSIS — E559 Vitamin D deficiency, unspecified: Secondary | ICD-10-CM | POA: Diagnosis not present

## 2021-11-26 DIAGNOSIS — E669 Obesity, unspecified: Secondary | ICD-10-CM | POA: Diagnosis not present

## 2021-11-26 DIAGNOSIS — E782 Mixed hyperlipidemia: Secondary | ICD-10-CM | POA: Diagnosis not present

## 2021-11-26 DIAGNOSIS — Z6834 Body mass index (BMI) 34.0-34.9, adult: Secondary | ICD-10-CM | POA: Diagnosis not present

## 2021-11-29 LAB — COLOGUARD: COLOGUARD: NEGATIVE

## 2021-12-09 DIAGNOSIS — Z419 Encounter for procedure for purposes other than remedying health state, unspecified: Secondary | ICD-10-CM | POA: Diagnosis not present

## 2021-12-11 DIAGNOSIS — Z6834 Body mass index (BMI) 34.0-34.9, adult: Secondary | ICD-10-CM | POA: Diagnosis not present

## 2021-12-11 DIAGNOSIS — T887XXA Unspecified adverse effect of drug or medicament, initial encounter: Secondary | ICD-10-CM | POA: Diagnosis not present

## 2022-01-06 DIAGNOSIS — Z419 Encounter for procedure for purposes other than remedying health state, unspecified: Secondary | ICD-10-CM | POA: Diagnosis not present

## 2022-01-08 DIAGNOSIS — Z6834 Body mass index (BMI) 34.0-34.9, adult: Secondary | ICD-10-CM | POA: Diagnosis not present

## 2022-01-08 DIAGNOSIS — E669 Obesity, unspecified: Secondary | ICD-10-CM | POA: Diagnosis not present

## 2022-01-29 DIAGNOSIS — Z6833 Body mass index (BMI) 33.0-33.9, adult: Secondary | ICD-10-CM | POA: Diagnosis not present

## 2022-01-29 DIAGNOSIS — R102 Pelvic and perineal pain: Secondary | ICD-10-CM | POA: Diagnosis not present

## 2022-02-06 DIAGNOSIS — Z419 Encounter for procedure for purposes other than remedying health state, unspecified: Secondary | ICD-10-CM | POA: Diagnosis not present

## 2022-02-18 ENCOUNTER — Encounter: Payer: Self-pay | Admitting: Obstetrics and Gynecology

## 2022-03-02 NOTE — Progress Notes (Deleted)
    GYNECOLOGY PROGRESS NOTE  Subjective:    Patient ID: Natalie [name], female    DOB: Dec 18, 1975, 46 y.o.   MRN: [id number]  HPI  Patient is a 46 y.o. [id number] female who presents for evaluation of bleeding during menopause.  {Common ambulatory SmartLinks:19316}  Review of Systems {ros; complete:30496}   Objective:   There were no vitals taken for this visit. There is no height or weight on file to calculate BMI. General appearance: {general exam:16600} Abdomen: {abdominal exam:16834} Pelvic: {pelvic exam:16852::"cervix normal in appearance","external genitalia normal","no adnexal masses or tenderness","no cervical motion tenderness","rectovaginal septum normal","uterus normal size, shape, and consistency","vagina normal without discharge"} Extremities: {extremity exam:5109} Neurologic: {neuro exam:17854}   Assessment:   No diagnosis found.   Plan:   There are no diagnoses linked to this encounter.   Rubie Maid, MD [facility]

## 2022-03-04 ENCOUNTER — Encounter: Payer: Medicaid Other | Admitting: Obstetrics and Gynecology

## 2022-03-04 DIAGNOSIS — R519 Headache, unspecified: Secondary | ICD-10-CM | POA: Diagnosis not present

## 2022-03-04 DIAGNOSIS — E669 Obesity, unspecified: Secondary | ICD-10-CM | POA: Diagnosis not present

## 2022-03-04 DIAGNOSIS — B9689 Other specified bacterial agents as the cause of diseases classified elsewhere: Secondary | ICD-10-CM | POA: Diagnosis not present

## 2022-03-04 DIAGNOSIS — J329 Chronic sinusitis, unspecified: Secondary | ICD-10-CM | POA: Diagnosis not present

## 2022-03-08 DIAGNOSIS — Z419 Encounter for procedure for purposes other than remedying health state, unspecified: Secondary | ICD-10-CM | POA: Diagnosis not present

## 2022-04-08 DIAGNOSIS — Z419 Encounter for procedure for purposes other than remedying health state, unspecified: Secondary | ICD-10-CM | POA: Diagnosis not present

## 2022-04-16 DIAGNOSIS — I10 Essential (primary) hypertension: Secondary | ICD-10-CM | POA: Diagnosis not present

## 2022-04-16 DIAGNOSIS — E782 Mixed hyperlipidemia: Secondary | ICD-10-CM | POA: Diagnosis not present

## 2022-04-16 DIAGNOSIS — E039 Hypothyroidism, unspecified: Secondary | ICD-10-CM | POA: Diagnosis not present

## 2022-04-16 DIAGNOSIS — F39 Unspecified mood [affective] disorder: Secondary | ICD-10-CM | POA: Diagnosis not present

## 2022-04-16 DIAGNOSIS — L709 Acne, unspecified: Secondary | ICD-10-CM | POA: Diagnosis not present

## 2022-04-16 DIAGNOSIS — R7989 Other specified abnormal findings of blood chemistry: Secondary | ICD-10-CM | POA: Diagnosis not present

## 2022-04-16 DIAGNOSIS — E559 Vitamin D deficiency, unspecified: Secondary | ICD-10-CM | POA: Diagnosis not present

## 2022-04-16 DIAGNOSIS — F419 Anxiety disorder, unspecified: Secondary | ICD-10-CM | POA: Diagnosis not present

## 2022-04-20 ENCOUNTER — Other Ambulatory Visit: Payer: Self-pay

## 2022-04-20 DIAGNOSIS — Q8740 Marfan's syndrome, unspecified: Secondary | ICD-10-CM

## 2022-04-20 NOTE — Progress Notes (Signed)
Original order was expired.

## 2022-04-27 ENCOUNTER — Ambulatory Visit
Admission: RE | Admit: 2022-04-27 | Discharge: 2022-04-27 | Disposition: A | Discharge status: Home / Self Care | Payer: Medicaid Other | Source: Ambulatory Visit | Attending: Obstetrics and Gynecology | Admitting: Obstetrics and Gynecology

## 2022-04-27 DIAGNOSIS — Z1231 Encounter for screening mammogram for malignant neoplasm of breast: Secondary | ICD-10-CM | POA: Diagnosis not present

## 2022-04-27 DIAGNOSIS — Z01419 Encounter for gynecological examination (general) (routine) without abnormal findings: Secondary | ICD-10-CM | POA: Diagnosis not present

## 2022-04-28 ENCOUNTER — Ambulatory Visit (INDEPENDENT_AMBULATORY_CARE_PROVIDER_SITE_OTHER): Payer: Medicaid Other

## 2022-04-28 DIAGNOSIS — Q8740 Marfan's syndrome, unspecified: Secondary | ICD-10-CM | POA: Diagnosis not present

## 2022-04-28 LAB — ECHOCARDIOGRAM COMPLETE
Area-P 1/2: 4.24 cm2
S' Lateral: 2.8 cm

## 2022-05-08 DIAGNOSIS — Z419 Encounter for procedure for purposes other than remedying health state, unspecified: Secondary | ICD-10-CM | POA: Diagnosis not present

## 2022-05-18 DIAGNOSIS — L02519 Cutaneous abscess of unspecified hand: Secondary | ICD-10-CM | POA: Diagnosis not present

## 2022-05-18 DIAGNOSIS — Z6833 Body mass index (BMI) 33.0-33.9, adult: Secondary | ICD-10-CM | POA: Diagnosis not present

## 2022-06-08 DIAGNOSIS — Z419 Encounter for procedure for purposes other than remedying health state, unspecified: Secondary | ICD-10-CM | POA: Diagnosis not present

## 2022-07-01 DIAGNOSIS — B3731 Acute candidiasis of vulva and vagina: Secondary | ICD-10-CM | POA: Diagnosis not present

## 2022-07-01 DIAGNOSIS — K2960 Other gastritis without bleeding: Secondary | ICD-10-CM | POA: Diagnosis not present

## 2022-07-01 DIAGNOSIS — Z6833 Body mass index (BMI) 33.0-33.9, adult: Secondary | ICD-10-CM | POA: Diagnosis not present

## 2022-07-09 DIAGNOSIS — Z419 Encounter for procedure for purposes other than remedying health state, unspecified: Secondary | ICD-10-CM | POA: Diagnosis not present

## 2022-08-08 DIAGNOSIS — Z419 Encounter for procedure for purposes other than remedying health state, unspecified: Secondary | ICD-10-CM | POA: Diagnosis not present

## 2022-08-09 ENCOUNTER — Ambulatory Visit (INDEPENDENT_AMBULATORY_CARE_PROVIDER_SITE_OTHER): Payer: Medicaid Other | Admitting: Urology

## 2022-08-09 VITALS — BP 120/81 | HR 93 | Wt 195.0 lb

## 2022-08-09 DIAGNOSIS — N3946 Mixed incontinence: Secondary | ICD-10-CM | POA: Diagnosis not present

## 2022-08-09 MED ORDER — FESOTERODINE FUMARATE ER 8 MG PO TB24: 8.0000 mg | ORAL_TABLET | Freq: Every day | ORAL | 3 refills | Status: DC

## 2022-08-09 MED ORDER — NITROFURANTOIN MONOHYD MACRO 100 MG PO CAPS: 100.0000 mg | ORAL_CAPSULE | Freq: Every day | ORAL | 3 refills | Status: DC

## 2022-08-09 NOTE — Progress Notes (Signed)
08/09/2022 3:35 PM   [name] 12/24/1975 [id number]  Referring provider: Rubie Maid, [address] [place],  [place] [address]  Chief Complaint  Patient presents with   Urinary Incontinence    HPI: I reviewed lengthy note.  Last time I did not think she had interstitial cystitis.  We should daily Macrodantin.  Incontinence greatly improved on Toviaz.  Normal cystoscopy last visit.  She has failed Vesicare and oxybutynin and Myrbetriq was expensive   Today No infections.  Doing great with reduced frequency and urge incontinence on Toviaz.  Little bit of burning.    Today Frequency stable.  Culture -1-year ago.  No infections on Macrobid.  Urgency much better.  Rare stress incontinence not wearing a pad.    PMH: Past Medical History:  Diagnosis Date   Bacterial vaginosis    Dysplasia of cervix, low grade (CIN 1) 02/15/2017   Will need repeat pap smear in 1 year.    Hyperlipemia    Hypertension    Hypothyroidism (acquired)    Marfan syndrome    Ovarian cyst    Vitamin D deficiency     Surgical History: Past Surgical History:  Procedure Laterality Date   DILATION AND CURETTAGE OF UTERUS     TONSILECTOMY/ADENOIDECTOMY WITH MYRINGOTOMY     TUBAL LIGATION      Home Medications:  Allergies as of 08/09/2022       Reactions   Hydrocodone-acetaminophen Itching   Oxycodone-acetaminophen Other (See Comments), Itching        Medication List        Accurate as of August 09, 2022  3:35 PM. If you have any questions, ask your nurse or doctor.          atorvastatin 20 MG tablet Commonly known as: LIPITOR Take 20 mg by mouth daily.   buPROPion 150 MG 24 hr tablet Commonly known as: WELLBUTRIN XL Take 1 tablet by mouth every morning.   busPIRone 10 MG tablet Commonly known as: BUSPAR Take 10 mg by mouth 3 (three) times daily as needed.   butalbital-acetaminophen-caffeine 50-325-40 MG tablet Commonly known as:  FIORICET SMARTSIG:1-3 Tablet(s) By Mouth Every 4 Hours PRN   escitalopram 10 MG tablet Commonly known as: LEXAPRO Take 10 mg by mouth daily.   fesoterodine 8 MG Tb24 tablet Commonly known as: TOVIAZ Take 1 tablet (8 mg total) by mouth daily.   fluticasone 50 MCG/ACT nasal spray Commonly known as: FLONASE SMARTSIG:1-2 Spray(s) Both Nares Daily PRN   levothyroxine 50 MCG tablet Commonly known as: SYNTHROID Take 50 mcg by mouth daily before breakfast.   loratadine 10 MG tablet Commonly known as: CLARITIN Take 10 mg by mouth daily.   montelukast 10 MG tablet Commonly known as: SINGULAIR SMARTSIG:1 Tablet(s) By Mouth Every Evening   nitrofurantoin (macrocrystal-monohydrate) 100 MG capsule Commonly known as: MACROBID Take 1 capsule (100 mg total) by mouth daily.   pantoprazole 40 MG tablet Commonly known as: PROTONIX Take 40 mg by mouth daily.   traZODone 150 MG tablet Commonly known as: DESYREL Take 150 mg by mouth at bedtime.   Vitamin D3 1.25 MG (50000 UT) Caps Take 1 capsule by mouth once a week.        Allergies:  Allergies  Allergen Reactions   Hydrocodone-Acetaminophen Itching   Oxycodone-Acetaminophen Other (See Comments) and Itching    Family History: Family History  Problem Relation Age of Onset   Hyperlipidemia Mother    Hyperthyroidism Mother    Thyroid  cancer Father    Marfan syndrome Father    Marfan syndrome Sister    Depression Sister    Depression Brother    Breast cancer Paternal Grandmother        great PGM    Social History:  reports that she quit smoking about 5 years ago. Her smoking use included cigarettes. She has a 5.00 pack-year smoking history. She has never used smokeless tobacco. She reports that she does not currently use alcohol. She reports that she does not use drugs.  ROS:                                        Physical Exam: BP 120/81   Pulse 93   Wt 88.5 kg   BMI 32.95 kg/m    Constitutional:  Alert and oriented, No acute distress. HEENT: [place] AT, moist mucus membranes.  Trachea midline, no masses. Cardiovascular: No clubbing, cyanosis, or edema. Respiratory: Normal respiratory effort, no increased work of breathing.   Laboratory Data: No results found for: "WBC", "HGB", "HCT", "MCV", "PLT"  No results found for: "CREATININE"  No results found for: "PSA"  No results found for: "TESTOSTERONE"  No results found for: "HGBA1C"  Urinalysis    Component Value Date/Time   COLORURINE Yellow 10/09/2013 0859   APPEARANCEUR Clear 08/03/2021 1548   LABSPEC 1.006 10/09/2013 0859   PHURINE 6.0 10/09/2013 0859   GLUCOSEU Negative 08/03/2021 1548   GLUCOSEU Negative 10/09/2013 0859   HGBUR Negative 10/09/2013 0859   BILIRUBINUR Negative 08/03/2021 1548   BILIRUBINUR Negative 10/09/2013 0859   KETONESUR Negative 10/09/2013 0859   PROTEINUR Negative 08/03/2021 1548   PROTEINUR Negative 10/09/2013 0859   UROBILINOGEN 0.2 05/07/2020 1122   NITRITE Negative 08/03/2021 1548   NITRITE Negative 10/09/2013 0859   LEUKOCYTESUR Negative 08/03/2021 1548   LEUKOCYTESUR Negative 10/09/2013 0859    Pertinent Imaging:   Assessment & Plan: Macrobid and Toviaz renewed 90x3 and I will see in a year  1. Mixed incontinence  - Urinalysis, Complete   No follow-ups on file.  Martina Sinner, MD  [facility] [address] [place], [place] [address] [phone]

## 2022-08-10 DIAGNOSIS — J40 Bronchitis, not specified as acute or chronic: Secondary | ICD-10-CM | POA: Diagnosis not present

## 2022-08-10 DIAGNOSIS — M25461 Effusion, right knee: Secondary | ICD-10-CM | POA: Diagnosis not present

## 2022-08-10 DIAGNOSIS — Z6833 Body mass index (BMI) 33.0-33.9, adult: Secondary | ICD-10-CM | POA: Diagnosis not present

## 2022-08-10 LAB — URINALYSIS, COMPLETE
Bilirubin, UA: NEGATIVE
Glucose, UA: NEGATIVE
Ketones, UA: NEGATIVE
Leukocytes,UA: NEGATIVE
Nitrite, UA: NEGATIVE
Protein,UA: NEGATIVE
RBC, UA: NEGATIVE
Specific Gravity, UA: 1.015 (ref 1.005–1.030)
Urobilinogen, Ur: 0.2 mg/dL (ref 0.2–1.0)
pH, UA: 6.5 (ref 5.0–7.5)

## 2022-08-10 LAB — MICROSCOPIC EXAMINATION: Bacteria, UA: NONE SEEN

## 2022-08-13 DIAGNOSIS — M1711 Unilateral primary osteoarthritis, right knee: Secondary | ICD-10-CM | POA: Diagnosis not present

## 2022-08-31 DIAGNOSIS — L739 Follicular disorder, unspecified: Secondary | ICD-10-CM | POA: Diagnosis not present

## 2022-08-31 DIAGNOSIS — Z6833 Body mass index (BMI) 33.0-33.9, adult: Secondary | ICD-10-CM | POA: Diagnosis not present

## 2022-09-08 DIAGNOSIS — Z419 Encounter for procedure for purposes other than remedying health state, unspecified: Secondary | ICD-10-CM | POA: Diagnosis not present

## 2022-09-10 DIAGNOSIS — G2581 Restless legs syndrome: Secondary | ICD-10-CM | POA: Diagnosis not present

## 2022-09-10 DIAGNOSIS — R300 Dysuria: Secondary | ICD-10-CM | POA: Diagnosis not present

## 2022-09-10 DIAGNOSIS — B3731 Acute candidiasis of vulva and vagina: Secondary | ICD-10-CM | POA: Diagnosis not present

## 2022-09-10 DIAGNOSIS — Z23 Encounter for immunization: Secondary | ICD-10-CM | POA: Diagnosis not present

## 2022-09-10 DIAGNOSIS — Z6833 Body mass index (BMI) 33.0-33.9, adult: Secondary | ICD-10-CM | POA: Diagnosis not present

## 2022-09-20 ENCOUNTER — Encounter: Payer: Self-pay | Admitting: Obstetrics and Gynecology

## 2022-10-05 ENCOUNTER — Encounter: Payer: Self-pay | Admitting: Obstetrics and Gynecology

## 2022-10-05 ENCOUNTER — Ambulatory Visit (INDEPENDENT_AMBULATORY_CARE_PROVIDER_SITE_OTHER): Payer: Medicaid Other | Admitting: Obstetrics and Gynecology

## 2022-10-05 VITALS — BP 120/91 | HR 96 | Resp 16 | Ht 64.5 in | Wt 200.7 lb

## 2022-10-05 DIAGNOSIS — N951 Menopausal and female climacteric states: Secondary | ICD-10-CM

## 2022-10-05 DIAGNOSIS — N950 Postmenopausal bleeding: Secondary | ICD-10-CM

## 2022-10-05 MED ORDER — BIJUVA 1-100 MG PO CAPS: 1.0000 | ORAL_CAPSULE | Freq: Every day | ORAL | 11 refills | Status: DC

## 2022-10-05 NOTE — Progress Notes (Addendum)
    GYNECOLOGY PROGRESS NOTE  Subjective:    Patient ID: Natalie [name], female    DOB: 13-Apr-1976, 46 y.o.   MRN: [id number]  HPI  Patient is a 46 y.o. [id number] female who presents for evaluation of brown vaginal spotting x 2 days. Spotting has resolved.  Noted associated breast tenderness as well and nausea. She also has concerns about night sweats and insomnia. She has been taking otc sleep aid that has help improve her sleep.   Of note, patient has been menopausal since 2020, but did also not an episode of spotting in April of this year that lasted for 3 days with cramping. Does have h/o vaginal atrophy.   The following portions of the patient's history were reviewed and updated as appropriate: allergies, current medications, past family history, past medical history, past social history, past surgical history, and problem list.  Review of Systems Pertinent items are noted in HPI.   Objective:   Blood pressure (!) 120/91, pulse 96, resp. rate 16, height 5' 4.5" (1.638 m), weight 200 lb 11.2 oz (91 kg), last menstrual period 02/10/2022. Body mass index is 33.92 kg/m. General appearance: alert and no distress Abdomen: soft, non-tender; bowel sounds normal; no masses,  no organomegaly Pelvic: external genitalia normal, rectovaginal septum normal.  Vagina without discharge.  Cervix normal appearing, no lesions and no motion tenderness.  Uterus mobile, nontender, normal shape and size.  Adnexae non-palpable, nontender bilaterally.   Extremities: extremities normal, atraumatic, no cyanosis or edema Neurologic: Grossly normal   Labs:   Latest Reference Range & Units 04/17/19 09:49  LH mIU/mL 46.7  FSH mIU/mL 64.0  Estradiol pg/mL 10.7  Progesterone ng/mL 0.5    Assessment:   1. Menopausal vasomotor syndrome   2. PMB (postmenopausal bleeding)      Plan:   Patient with vasomotor symptoms.  Discussed options for HRT vs OTC natural supplements. Patient has tried supplements in the  past. Willing to try HRT again. Will initiate on Bijuva. She has tried several options in the past for her intermittent perimenopausal and now menopausal symptoms (ncluding Amethia in 2018 when perimenopausal, Prempro in 2018 and 2019 which caused side effects).  Patient with h/o vaginal atrophy. Has been worked up in the past for sporadic episodes of vaginal bleeding which have been negative.  Has tried local estrogen therapy without success.  May improve with use of bijuva.  If no improvement, consider pelvic ultrasound. Has tried Estring in the past but discontinued due to cost. Premarin cream also attempted but was "too messy".  Will reassess symptoms in January with annual exam.     Hildred Laser, MD [facility]

## 2022-10-08 DIAGNOSIS — Z419 Encounter for procedure for purposes other than remedying health state, unspecified: Secondary | ICD-10-CM | POA: Diagnosis not present

## 2022-10-18 DIAGNOSIS — R0602 Shortness of breath: Secondary | ICD-10-CM | POA: Diagnosis not present

## 2022-10-18 DIAGNOSIS — Z6834 Body mass index (BMI) 34.0-34.9, adult: Secondary | ICD-10-CM | POA: Diagnosis not present

## 2022-10-18 DIAGNOSIS — R300 Dysuria: Secondary | ICD-10-CM | POA: Diagnosis not present

## 2022-10-22 ENCOUNTER — Encounter: Payer: Self-pay | Admitting: Obstetrics and Gynecology

## 2022-10-25 ENCOUNTER — Encounter: Payer: Self-pay | Admitting: Obstetrics and Gynecology

## 2022-11-03 ENCOUNTER — Ambulatory Visit: Payer: Medicaid Other | Admitting: Obstetrics and Gynecology

## 2022-11-08 DIAGNOSIS — Z419 Encounter for procedure for purposes other than remedying health state, unspecified: Secondary | ICD-10-CM | POA: Diagnosis not present

## 2022-11-15 NOTE — Progress Notes (Unsigned)
ANNUAL PREVENTATIVE CARE GYNECOLOGY  ENCOUNTER NOTE  Subjective:       Natalie [name] is a 47 y.o. [phone] female here for a routine annual gynecologic exam. The patient is sexually active. The patient has been on hormone replacement therapy in the past. The patient wears seatbelts: yes. The patient participates in regular exercise: yes, walks on the treadmill and lifts dumbbells sometimes. Has the patient ever been transfused or tattooed?: yes. The patient reports that there is not domestic violence in her life.     Current complaints: 1.  She reports that a trial of Bijuva worked for her vasomotor symptoms, however notes that she was unable to receive her medication from her pharmacy.  She also notes that after starting the medication her menstrual cycles resumed.   Gynecologic History Patient's last menstrual period was 02/10/2022 (exact date). Contraception: post menopausal status Last Pap: 04/17/2019. Results were: normal  Reports remote h/o mildly abnormal pap smear in the past.  Last mammogram: 04/27/2022. Results were: normal Last Colonoscopy: Cologuard (11/22/2021): Negative Last Dexa Scan: Never done   Obstetric History OB History  Gravida Para Term Preterm AB Living  5 3 2 1 2 2   SAB IAB Ectopic Multiple Live Births  2       3    # Outcome Date GA Lbr Len/2nd Weight Sex Delivery Anes PTL Lv  5 Term         LIV  4 SAB           3 SAB           2 Term         ND  1 Preterm     F Vag-Spont   LIV    Past Medical History:  Diagnosis Date   Bacterial vaginosis    Dysplasia of cervix, low grade (CIN 1) 02/15/2017   Will need repeat pap smear in 1 year.    Hyperlipemia    Hypertension    Hypothyroidism (acquired)    Marfan syndrome    Ovarian cyst    Vitamin D deficiency     Family History  Problem Relation Age of Onset   Hyperlipidemia Mother    Hyperthyroidism Mother    Thyroid cancer Father    Marfan syndrome Father    Marfan syndrome Sister     Depression Sister    Depression Brother    Breast cancer Paternal Grandmother        great PGM    Past Surgical History:  Procedure Laterality Date   DILATION AND CURETTAGE OF UTERUS     TONSILECTOMY/ADENOIDECTOMY WITH MYRINGOTOMY     TUBAL LIGATION      Social History   Socioeconomic History   Marital status: Married    Spouse name: Not on file   Number of children: Not on file   Years of education: Not on file   Highest education level: Not on file  Occupational History   Not on file  Tobacco Use   Smoking status: Former    Packs/day: 0.50    Years: 10.00    Total pack years: 5.00    Types: Cigarettes    Quit date: 12/12/2016    Years since quitting: 5.9   Smokeless tobacco: Never  Vaping Use   Vaping Use: Never used  Substance and Sexual Activity   Alcohol use: Not Currently    Alcohol/week: 0.0 standard drinks of alcohol   Drug use: No   Sexual activity: Yes  Birth control/protection: None  Other Topics Concern   Not on file  Social History Narrative   Not on file   Social Determinants of Health   Financial Resource Strain: Not on file  Food Insecurity: Not on file  Transportation Needs: Not on file  Physical Activity: Not on file  Stress: Not on file  Social Connections: Not on file  Intimate Partner Violence: Not on file    Current Outpatient Medications on File Prior to Visit  Medication Sig Dispense Refill   atorvastatin (LIPITOR) 20 MG tablet Take 20 mg by mouth daily.     buPROPion (WELLBUTRIN XL) 150 MG 24 hr tablet Take 1 tablet by mouth every morning.     busPIRone (BUSPAR) 10 MG tablet Take 10 mg by mouth 3 (three) times daily as needed.     butalbital-acetaminophen-caffeine (FIORICET) 50-325-40 MG tablet SMARTSIG:1-3 Tablet(s) By Mouth Every 4 Hours PRN     Cholecalciferol (VITAMIN D3) 1.25 MG (50000 UT) CAPS Take 1 capsule by mouth once a week.      escitalopram (LEXAPRO) 10 MG tablet Take 10 mg by mouth daily.     Estradiol-Progesterone  (BIJUVA) 1-100 MG CAPS Take 1 tablet by mouth daily. 30 capsule 11   fesoterodine (TOVIAZ) 8 MG TB24 tablet Take 1 tablet (8 mg total) by mouth daily. 90 tablet 3   fluticasone (FLONASE) 50 MCG/ACT nasal spray SMARTSIG:1-2 Spray(s) Both Nares Daily PRN     levothyroxine (SYNTHROID) 50 MCG tablet Take 50 mcg by mouth daily before breakfast.     loratadine (CLARITIN) 10 MG tablet Take 10 mg by mouth daily.     montelukast (SINGULAIR) 10 MG tablet SMARTSIG:1 Tablet(s) By Mouth Every Evening     nitrofurantoin, macrocrystal-monohydrate, (MACROBID) 100 MG capsule Take 1 capsule (100 mg total) by mouth daily. 90 capsule 3   pantoprazole (PROTONIX) 40 MG tablet Take 40 mg by mouth daily.  5   No current facility-administered medications on file prior to visit.    Allergies  Allergen Reactions   Hydrocodone-Acetaminophen Itching   Oxycodone-Acetaminophen Other (See Comments) and Itching      Review of Systems ROS Review of Systems - General ROS: negative for - chills, fatigue, fever, hot flashes, night sweats, weight gain or weight loss Psychological ROS: negative for - anxiety, decreased libido, depression, mood swings, physical abuse or sexual abuse Ophthalmic ROS: negative for - blurry vision, eye pain or loss of vision ENT ROS: negative for - headaches, hearing change, visual changes or vocal changes Allergy and Immunology ROS: negative for - hives, itchy/watery eyes or seasonal allergies Hematological and Lymphatic ROS: negative for - bleeding problems, bruising, swollen lymph nodes or weight loss Endocrine ROS: negative for - galactorrhea, hair pattern changes, hot flashes, malaise/lethargy, mood swings, palpitations, polydipsia/polyuria, skin changes, temperature intolerance or unexpected weight changes Breast ROS: negative for - new or changing breast lumps or nipple discharge Respiratory ROS: negative for - cough or shortness of breath Cardiovascular ROS: negative for - chest pain,  irregular heartbeat, palpitations or shortness of breath Gastrointestinal ROS: no abdominal pain, change in bowel habits, or black or bloody stools Genito-Urinary ROS: no dysuria, trouble voiding, or hematuria Musculoskeletal ROS: negative for - joint pain or joint stiffness Neurological ROS: negative for - bowel and bladder control changes Dermatological ROS: negative for rash and skin lesion changes   Objective:   BP 120/77   Pulse 83   Resp 16   Ht 5' 4.17" (1.63 m)   Wt 204 lb  8 oz (92.8 kg)   LMP 02/10/2022 (Exact Date)   BMI 34.91 kg/m     CONSTITUTIONAL: Well-developed, well-nourished female in no acute distress.  PSYCHIATRIC: Normal mood and affect. Normal behavior. Normal judgment and thought content. NEUROLGIC: Alert and oriented to person, place, and time. Normal muscle tone coordination. No cranial nerve deficit noted. HENT:  Normocephalic, atraumatic, External right and left ear normal. Oropharynx is clear and moist EYES: Conjunctivae and EOM are normal. Pupils are equal, round, and reactive to light. No scleral icterus.  NECK: Normal range of motion, supple, no masses.  Normal thyroid.  SKIN: Skin is warm and dry. No rash noted. Not diaphoretic. No erythema. No pallor. CARDIOVASCULAR: Normal heart rate noted, regular rhythm, no murmur. RESPIRATORY: Clear to auscultation bilaterally. Effort and breath sounds normal, no problems with respiration noted. BREASTS: Symmetric in size. No masses, skin changes, nipple drainage, or lymphadenopathy. ABDOMEN: Soft, normal bowel sounds, no distention noted.  No tenderness, rebound or guarding.  BLADDER: Normal PELVIC:  Bladder no bladder distension noted  Urethra: normal appearing urethra with no masses, tenderness or lesions  Vulva: normal appearing vulva with no masses, tenderness or lesions  Vagina: mildly atrophic vagina without lesions or discharge.   Cervix: normal appearing cervix without discharge or lesions  Uterus:  uterus is normal size, shape, consistency and nontender  Adnexa: normal adnexa in size, nontender and no masses  RV: External Exam NormaI, No Rectal Masses, and Normal Sphincter tone  MUSCULOSKELETAL: Normal range of motion. No tenderness.  No cyanosis, clubbing, or edema.  2+ distal pulses. LYMPHATIC: No Axillary, Supraclavicular, or Inguinal Adenopathy.   Labs: Has labs by PCP  Assessment:   1. Encounter for screening mammogram for malignant neoplasm of breast   2. Encounter for well woman exam with routine gynecological exam   3. Cervical cancer screening   4. Screen for STD (sexually transmitted disease)   5. Screening for diabetes mellitus (DM)   6. Screening cholesterol level   7. Menopausal vasomotor syndrome   8. Vaginal atrophy   9. Avitaminosis D      Plan:  Pap: Pap Co Test Mammogram: Ordered Colon Screening:   up to date  Labs:  Ordered today (has previous order by PCP).  Routine preventative health maintenance measures emphasized:  Self Breast Exams and Exercise/Diet/Weight control COVID Vaccination status: Eligible for latest dose in series.  Flu vaccine: up to date.  Menopausal symptoms and vaginal atrophy, will attempt to help patient receive prescription of Bijuva. Given samples until that time.  Return to Clinic - 1 Year   Hildred Laser, MD [facility]

## 2022-11-17 ENCOUNTER — Encounter: Payer: Self-pay | Admitting: Obstetrics and Gynecology

## 2022-11-17 ENCOUNTER — Other Ambulatory Visit (HOSPITAL_COMMUNITY)
Admission: RE | Admit: 2022-11-17 | Discharge: 2022-11-17 | Disposition: A | Discharge status: Home / Self Care | Payer: Medicaid Other | Source: Ambulatory Visit | Attending: Obstetrics and Gynecology | Admitting: Obstetrics and Gynecology

## 2022-11-17 ENCOUNTER — Ambulatory Visit (INDEPENDENT_AMBULATORY_CARE_PROVIDER_SITE_OTHER): Payer: Medicaid Other | Admitting: Obstetrics and Gynecology

## 2022-11-17 VITALS — BP 120/77 | HR 83 | Resp 16 | Ht 64.17 in | Wt 204.5 lb

## 2022-11-17 DIAGNOSIS — E559 Vitamin D deficiency, unspecified: Secondary | ICD-10-CM

## 2022-11-17 DIAGNOSIS — N951 Menopausal and female climacteric states: Secondary | ICD-10-CM

## 2022-11-17 DIAGNOSIS — Z13220 Encounter for screening for lipoid disorders: Secondary | ICD-10-CM

## 2022-11-17 DIAGNOSIS — Z1231 Encounter for screening mammogram for malignant neoplasm of breast: Secondary | ICD-10-CM

## 2022-11-17 DIAGNOSIS — Z01419 Encounter for gynecological examination (general) (routine) without abnormal findings: Secondary | ICD-10-CM | POA: Diagnosis not present

## 2022-11-17 DIAGNOSIS — Z113 Encounter for screening for infections with a predominantly sexual mode of transmission: Secondary | ICD-10-CM

## 2022-11-17 DIAGNOSIS — Z1151 Encounter for screening for human papillomavirus (HPV): Secondary | ICD-10-CM | POA: Diagnosis not present

## 2022-11-17 DIAGNOSIS — Z124 Encounter for screening for malignant neoplasm of cervix: Secondary | ICD-10-CM

## 2022-11-17 DIAGNOSIS — N952 Postmenopausal atrophic vaginitis: Secondary | ICD-10-CM

## 2022-11-17 DIAGNOSIS — Z131 Encounter for screening for diabetes mellitus: Secondary | ICD-10-CM

## 2022-11-17 DIAGNOSIS — R7989 Other specified abnormal findings of blood chemistry: Secondary | ICD-10-CM | POA: Diagnosis not present

## 2022-11-17 NOTE — Patient Instructions (Signed)

## 2022-11-18 LAB — HEPATITIS C ANTIBODY: Hep C Virus Ab: NONREACTIVE

## 2022-11-19 LAB — CYTOLOGY - PAP
Adequacy: ABSENT
Comment: NEGATIVE
Diagnosis: NEGATIVE
High risk HPV: NEGATIVE

## 2022-11-25 ENCOUNTER — Encounter: Payer: Self-pay | Admitting: Obstetrics and Gynecology

## 2022-11-25 ENCOUNTER — Other Ambulatory Visit: Payer: Self-pay | Admitting: Obstetrics and Gynecology

## 2022-11-25 MED ORDER — NORETHINDRONE-ETH ESTRADIOL 0.5-2.5 MG-MCG PO TABS: 1.0000 | ORAL_TABLET | Freq: Every day | ORAL | 3 refills | Status: DC

## 2022-12-09 DIAGNOSIS — Z419 Encounter for procedure for purposes other than remedying health state, unspecified: Secondary | ICD-10-CM | POA: Diagnosis not present

## 2022-12-24 ENCOUNTER — Ambulatory Visit: Payer: Medicaid Other | Attending: Cardiology | Admitting: Cardiology

## 2022-12-24 ENCOUNTER — Encounter: Payer: Self-pay | Admitting: Cardiology

## 2022-12-24 VITALS — BP 116/86 | HR 97 | Ht 64.0 in | Wt 208.2 lb

## 2022-12-24 DIAGNOSIS — E7800 Pure hypercholesterolemia, unspecified: Secondary | ICD-10-CM | POA: Diagnosis not present

## 2022-12-24 DIAGNOSIS — R079 Chest pain, unspecified: Secondary | ICD-10-CM

## 2022-12-24 DIAGNOSIS — I10 Essential (primary) hypertension: Secondary | ICD-10-CM | POA: Diagnosis not present

## 2022-12-24 DIAGNOSIS — Q8740 Marfan's syndrome, unspecified: Secondary | ICD-10-CM | POA: Diagnosis not present

## 2022-12-24 NOTE — Progress Notes (Signed)
Cardiology Office Note:    Date:  12/24/2022   ID:  [name], DOB 1975/11/18, MRN [id number]  PCP:  Rubie Maid, MD   [employer] Providers Cardiologist:  Kate Sable, MD     Referring MD: Rubie Maid, MD   Chief Complaint  Patient presents with   Advice Only    Overdue annual F/u,occasional chest pain    History of Present Illness:    Natalie [name] is a 47 y.o. female with a hx of  hyperlipidemia, Marfan syndrome who presents for follow-up.   Being seen due to history of Marfan syndrome.  Her father had aortic dilatation needing surgery in his 53s.  Previous echo 6/22 was normal.  Repeat echocardiogram performed 1 year later 6/23 to evaluate any structural differences.  BP adequately controlled, feels well, shortness of breath.  Has chest pain may be once in 3 months, associated with anxiety.  No new concerns at this time.  Prior notes Echo [id number] normal systolic and diastolic function, normal aortic root and ascending aorta size. Echo 6/22 EF 60 to [id number], normal diastolic function, normal aortic root size. History of Marfan's in her father, also father has history of aortic dilatation needing surgery in his 71s.    Past Medical History:  Diagnosis Date   Bacterial vaginosis    Dysplasia of cervix, low grade (CIN 1) 02/15/2017   Will need repeat pap smear in 1 year.    Hyperlipemia    Hypertension    Hypothyroidism (acquired)    Marfan syndrome    Ovarian cyst    Vitamin D deficiency     Past Surgical History:  Procedure Laterality Date   DILATION AND CURETTAGE OF UTERUS     TONSILECTOMY/ADENOIDECTOMY WITH MYRINGOTOMY     TUBAL LIGATION      Current Medications: Current Meds  Medication Sig   atorvastatin (LIPITOR) 20 MG tablet Take 20 mg by mouth daily.   buPROPion (WELLBUTRIN XL) 150 MG 24 hr tablet Take 1 tablet by mouth every morning.   busPIRone (BUSPAR) 10 MG tablet Take 10 mg by mouth 3 (three) times daily as needed.    butalbital-acetaminophen-caffeine (FIORICET) 50-325-40 MG tablet SMARTSIG:1-3 Tablet(s) By Mouth Every 4 Hours PRN   chlorhexidine (PERIDEX) 0.12 % solution Use as directed 10 mLs in the mouth or throat 2 (two) times daily.   Cholecalciferol (VITAMIN D3) 1.25 MG (50000 UT) CAPS Take 1 capsule by mouth once a week.    escitalopram (LEXAPRO) 10 MG tablet Take 10 mg by mouth daily.   fesoterodine (TOVIAZ) 8 MG TB24 tablet Take 1 tablet (8 mg total) by mouth daily.   fluticasone (FLONASE) 50 MCG/ACT nasal spray SMARTSIG:1-2 Spray(s) Both Nares Daily PRN   levothyroxine (SYNTHROID) 50 MCG tablet Take 50 mcg by mouth daily before breakfast.   loratadine (CLARITIN) 10 MG tablet Take 10 mg by mouth daily.   montelukast (SINGULAIR) 10 MG tablet SMARTSIG:1 Tablet(s) By Mouth Every Evening   nitrofurantoin, macrocrystal-monohydrate, (MACROBID) 100 MG capsule Take 1 capsule (100 mg total) by mouth daily.   pantoprazole (PROTONIX) 40 MG tablet Take 40 mg by mouth daily.   triamterene-hydrochlorothiazide (MAXZIDE-25) 37.5-25 MG tablet Take 1 tablet by mouth daily.   VENTOLIN HFA 108 (90 Base) MCG/ACT inhaler Inhale 1 puff into the lungs every 4 (four) hours as needed.     Allergies:   Hydrocodone-acetaminophen and Oxycodone-acetaminophen   Social History   Socioeconomic History   Marital status: Married    Spouse name: Not on file  Number of children: Not on file   Years of education: Not on file   Highest education level: Not on file  Occupational History   Not on file  Tobacco Use   Smoking status: Former    Packs/day: 0.50    Years: 10.00    Total pack years: 5.00    Types: Cigarettes    Quit date: 12/12/2016    Years since quitting: 6.0   Smokeless tobacco: Never  Vaping Use   Vaping Use: Never used  Substance and Sexual Activity   Alcohol use: Not Currently    Alcohol/week: 0.0 standard drinks of alcohol   Drug use: Yes    Types: Marijuana    Comment: occasional gummy edible    Sexual activity: Yes    Birth control/protection: None  Other Topics Concern   Not on file  Social History Narrative   Not on file   Social Determinants of Health   Financial Resource Strain: Not on file  Food Insecurity: Not on file  Transportation Needs: Not on file  Physical Activity: Not on file  Stress: Not on file  Social Connections: Not on file     Family History: The patient's family history includes Breast cancer in her paternal grandmother; Depression in her brother and sister; Hyperlipidemia in her mother; Hyperthyroidism in her mother; Marfan syndrome in her father and sister; Thyroid cancer in her father.  ROS:   Please see the history of present illness.     All other systems reviewed and are negative.  EKGs/Labs/Other Studies Reviewed:    The following studies were reviewed today:   EKG:  EKG not  ordered today.   Recent Labs: No results found for requested labs within last 365 days.  Recent Lipid Panel No results found for: "CHOL", "TRIG", "HDL", "CHOLHDL", "VLDL", "LDLCALC", "LDLDIRECT"   Risk Assessment/Calculations:      Physical Exam:    VS:  BP 116/86 (BP Location: Left Arm, Patient Position: Sitting, Cuff Size: Large)   Pulse 97   Ht 5' 4"$  (1.626 m)   Wt 208 lb 3.2 oz (94.4 kg)   LMP 02/10/2022 (Exact Date)   SpO2 96%   BMI 35.74 kg/m     Wt Readings from Last 3 Encounters:  12/24/22 208 lb 3.2 oz (94.4 kg)  11/17/22 204 lb 8 oz (92.8 kg)  10/05/22 200 lb 11.2 oz (91 kg)     GEN:  Well nourished, well developed in no acute distress HEENT: Normal NECK: No JVD; No carotid bruits CARDIAC: RRR, no murmurs, rubs, gallops RESPIRATORY:  Clear to auscultation without rales, wheezing or rhonchi  ABDOMEN: Soft, non-tender, non-distended MUSCULOSKELETAL:  No edema; No deformity  SKIN: Warm and dry NEUROLOGIC:  Alert and oriented x 3 PSYCHIATRIC:  Normal affect   ASSESSMENT:    1. Marfan's syndrome   2. Chest pain, unspecified type    3. Primary hypertension   4. Pure hypercholesterolemia    PLAN:    In order of problems listed above:  Patient with history of Marfan syndrome.  Aortic dilatation and surgery in her dad around age 36s.  Repeat echocardiogram showed normal systolic and diastolic function, normal aortic root and ascending aorta size, similar to prior.  Repeat echo in about 3 to 5 years.   Chest pain associated with anxiety.  Appears noncardiac. Hypertension, BP controlled.  Continue Maxzide. Hyperlipidemia, continue Lipitor.  Follow-up after repeat echo in 3 to 5 years..   Medication Adjustments/Labs and Tests Ordered: Current medicines are  reviewed at length with the patient today.  Concerns regarding medicines are outlined above.  Orders Placed This Encounter  Procedures   EKG 12-Lead    No orders of the defined types were placed in this encounter.    Patient Instructions  Medication Instructions:   Your physician recommends that you continue on your current medications as directed. Please refer to the Current Medication list given to you today.  *If you need a refill on your cardiac medications before your next appointment, please call your pharmacy*   Lab Work:  None Ordered  If you have labs (blood work) drawn today and your tests are completely normal, you will receive your results only by: [place] (if you have MyChart) OR A paper copy in the mail If you have any lab test that is abnormal or we need to change your treatment, we will call you to review the results.   Testing/Procedures:  None Ordered   Follow-Up: At [facility], you and your health needs are our priority.  As part of our continuing mission to provide you with exceptional heart care, we have created designated Provider Care Teams.  These Care Teams include your primary Cardiologist (physician) and Advanced Practice Providers (APPs -  Physician Assistants and Nurse Practitioners) who all work  together to provide you with the care you need, when you need it.  We recommend signing up for the patient portal called "MyChart".  Sign up information is provided on this After Visit Summary.  MyChart is used to connect with patients for Virtual Visits (Telemedicine).  Patients are able to view lab/test results, encounter notes, upcoming appointments, etc.  Non-urgent messages can be sent to your provider as well.   To learn more about what you can do with MyChart, go to [email or web address].    Your next appointment:   2 year(s)  Provider:   You may see Kate Sable, MD or one of the following Advanced Practice Providers on your designated Care Team:   Murray Hodgkins, NP Christell Faith, PA-C Cadence Kathlen Mody, PA-C Gerrie Nordmann, NP   Signed, Kate Sable, MD  12/24/2022 10:30 AM    [place]

## 2022-12-24 NOTE — Patient Instructions (Signed)
Medication Instructions:   Your physician recommends that you continue on your current medications as directed. Please refer to the Current Medication list given to you today.  *If you need a refill on your cardiac medications before your next appointment, please call your pharmacy*   Lab Work:  None Ordered  If you have labs (blood work) drawn today and your tests are completely normal, you will receive your results only by: [place] (if you have MyChart) OR A paper copy in the mail If you have any lab test that is abnormal or we need to change your treatment, we will call you to review the results.   Testing/Procedures:  None Ordered   Follow-Up: At [facility], you and your health needs are our priority.  As part of our continuing mission to provide you with exceptional heart care, we have created designated Provider Care Teams.  These Care Teams include your primary Cardiologist (physician) and Advanced Practice Providers (APPs -  Physician Assistants and Nurse Practitioners) who all work together to provide you with the care you need, when you need it.  We recommend signing up for the patient portal called "MyChart".  Sign up information is provided on this After Visit Summary.  MyChart is used to connect with patients for Virtual Visits (Telemedicine).  Patients are able to view lab/test results, encounter notes, upcoming appointments, etc.  Non-urgent messages can be sent to your provider as well.   To learn more about what you can do with MyChart, go to [email or web address].    Your next appointment:   2 year(s)  Provider:   You may see Kate Sable, MD or one of the following Advanced Practice Providers on your designated Care Team:   Murray Hodgkins, NP Christell Faith, PA-C Cadence Kathlen Mody, PA-C Gerrie Nordmann, NP

## 2023-01-19 DIAGNOSIS — R7989 Other specified abnormal findings of blood chemistry: Secondary | ICD-10-CM | POA: Diagnosis not present

## 2023-01-19 DIAGNOSIS — Q4479 Other congenital malformations of liver: Secondary | ICD-10-CM | POA: Diagnosis not present

## 2023-01-19 DIAGNOSIS — K8020 Calculus of gallbladder without cholecystitis without obstruction: Secondary | ICD-10-CM | POA: Diagnosis not present

## 2023-02-23 ENCOUNTER — Telehealth: Payer: Self-pay

## 2023-02-23 NOTE — Telephone Encounter (Signed)
Pt called triage and said she wanted to let Dr. Valentino Saxon know how the new Rx is going. She started taking the birth control immediately after she sent it on 11/25/22 and everything was good up until 3 weeks ago. 3 weeks ago she noticed on/off spotting every time she wiped. Denies cramping/pelvic pain, uti sx.

## 2023-02-24 NOTE — Telephone Encounter (Signed)
Pt aware.

## 2023-03-07 ENCOUNTER — Telehealth: Payer: Self-pay

## 2023-03-07 MED ORDER — VEOZAH 45 MG PO TABS: 1.0000 | ORAL_TABLET | Freq: Every day | ORAL | 3 refills | Status: DC

## 2023-03-07 NOTE — Telephone Encounter (Signed)
Patient calling in stating she stopped taking her OCP/HRT 1 week ago due to ongoing spotting over the last month. Over the last week she reports light to moderate bleeding, mainly noticeable when wiping. She states she would rather deal with hot flashes rather than bleeding since she is postmenopausal. Please advise

## 2023-03-07 NOTE — Telephone Encounter (Signed)
No. This tends to happen when she tries hormone therapy. I think it tries to bring her out of menopause.

## 2023-03-07 NOTE — Telephone Encounter (Signed)
Ok.  Also let her know that she has other options as well, including a 21 day on, 7 day off regimen, or increasing the progesterone in the medication to decrease the bleeding.  Also, can trial a new medication called Veozah which helps with hot flashes but is not hormonal.

## 2023-03-07 NOTE — Telephone Encounter (Signed)
Forwarded to patient via Mychart

## 2023-05-11 DIAGNOSIS — Z6833 Body mass index (BMI) 33.0-33.9, adult: Secondary | ICD-10-CM | POA: Diagnosis not present

## 2023-06-02 ENCOUNTER — Telehealth: Payer: Self-pay

## 2023-06-02 NOTE — Telephone Encounter (Signed)
Patient left a message on the voicemail to schedule her colonoscopy and she has some questions before scheduling also.

## 2023-06-03 ENCOUNTER — Encounter: Payer: Self-pay | Admitting: *Deleted

## 2023-06-03 ENCOUNTER — Telehealth: Payer: Self-pay

## 2023-06-03 DIAGNOSIS — R1084 Generalized abdominal pain: Secondary | ICD-10-CM | POA: Diagnosis not present

## 2023-06-03 DIAGNOSIS — Z1211 Encounter for screening for malignant neoplasm of colon: Secondary | ICD-10-CM | POA: Diagnosis not present

## 2023-06-03 DIAGNOSIS — F411 Generalized anxiety disorder: Secondary | ICD-10-CM | POA: Diagnosis not present

## 2023-06-03 DIAGNOSIS — E669 Obesity, unspecified: Secondary | ICD-10-CM | POA: Diagnosis not present

## 2023-06-03 NOTE — Telephone Encounter (Signed)
Returned patients call to answer her questions prior to scheduling her colonoscopy.  Left voice message for her to call me back.  Thanks, [employer], [place]

## 2023-08-05 ENCOUNTER — Ambulatory Visit: Payer: 59

## 2023-08-08 ENCOUNTER — Ambulatory Visit (INDEPENDENT_AMBULATORY_CARE_PROVIDER_SITE_OTHER): Payer: 59 | Admitting: Urology

## 2023-08-08 ENCOUNTER — Encounter: Payer: Self-pay | Admitting: Urology

## 2023-08-08 VITALS — BP 121/86 | HR 97

## 2023-08-08 DIAGNOSIS — N3946 Mixed incontinence: Secondary | ICD-10-CM

## 2023-08-08 MED ORDER — FESOTERODINE FUMARATE ER 8 MG PO TB24: 8.0000 mg | ORAL_TABLET | Freq: Every day | ORAL | 3 refills | Status: AC

## 2023-08-08 MED ORDER — NITROFURANTOIN MONOHYD MACRO 100 MG PO CAPS: 100.0000 mg | ORAL_CAPSULE | Freq: Every day | ORAL | 3 refills | Status: AC

## 2023-08-08 NOTE — Progress Notes (Signed)
08/08/2023 3:16 PM   Natalie [name] 06-01-1976 [id number]  Referring provider: Hildred Laser, MD [address] [place],  [place] [address]  Chief Complaint  Patient presents with   Follow-up    HPI: I reviewed lengthy note.  Last time I did not think she had interstitial cystitis.  We should daily Macrodantin.  Incontinence greatly improved on Toviaz.  Normal cystoscopy last visit.  She has failed Vesicare and oxybutynin and Myrbetriq was expensive   Today No infections.  Doing great with reduced frequency and urge incontinence on Toviaz.  Little bit of burning.     Today Frequency stable.  Culture -1-year ago.  No infections on Macrobid.  Urgency much better.  Rare stress incontinence not wearing a pad.  Macrobid and Gala Murdoch renewed 90x3 and I will see in a year   Today Urgency dramatically better on Toviaz.  Infection free on Macrobid.  Urine negative.   PMH: Past Medical History:  Diagnosis Date   Bacterial vaginosis    Dysplasia of cervix, low grade (CIN 1) 02/15/2017   Will need repeat pap smear in 1 year.    Hyperlipemia    Hypertension    Hypothyroidism (acquired)    Marfan syndrome    Ovarian cyst    Vitamin D deficiency     Surgical History: Past Surgical History:  Procedure Laterality Date   DILATION AND CURETTAGE OF UTERUS     TONSILECTOMY/ADENOIDECTOMY WITH MYRINGOTOMY     TUBAL LIGATION      Home Medications:  Allergies as of 08/08/2023       Reactions   Hydrocodone-acetaminophen Itching   Oxycodone-acetaminophen Other (See Comments), Itching        Medication List        Accurate as of August 08, 2023  3:16 PM. If you have any questions, ask your nurse or doctor.          STOP taking these medications    butalbital-acetaminophen-caffeine 50-325-40 MG tablet Commonly known as: FIORICET Stopped by: Lorin Picket A Lahoma Constantin   chlorhexidine 0.12 % solution Commonly known as: PERIDEX Stopped by: Lorin Picket A Derika Eckles    fluticasone 50 MCG/ACT nasal spray Commonly known as: FLONASE Stopped by: Lorin Picket A Eusebia Grulke   loratadine 10 MG tablet Commonly known as: CLARITIN Stopped by: Lorin Picket A Lenya Sterne   montelukast 10 MG tablet Commonly known as: SINGULAIR Stopped by: Lorin Picket A Shoichi Mielke   norethindrone-ethinyl estradiol 0.5-2.5 MG-MCG tablet Commonly known as: Fyavolv Stopped by: Lorin Picket A Kandice Schmelter   Veozah 45 MG Tabs Generic drug: Fezolinetant Stopped by: Lorin Picket A Nadene Witherspoon       TAKE these medications    atorvastatin 20 MG tablet Commonly known as: LIPITOR Take 20 mg by mouth daily.   buPROPion 150 MG 24 hr tablet Commonly known as: WELLBUTRIN XL Take 1 tablet by mouth every morning.   busPIRone 10 MG tablet Commonly known as: BUSPAR Take 10 mg by mouth 3 (three) times daily as needed.   escitalopram 10 MG tablet Commonly known as: LEXAPRO Take 10 mg by mouth daily.   fesoterodine 8 MG Tb24 tablet Commonly known as: TOVIAZ Take 1 tablet (8 mg total) by mouth daily.   levothyroxine 50 MCG tablet Commonly known as: SYNTHROID Take 50 mcg by mouth daily before breakfast.   nitrofurantoin (macrocrystal-monohydrate) 100 MG capsule Commonly known as: MACROBID Take 1 capsule (100 mg total) by mouth daily.   pantoprazole 40 MG tablet Commonly known as: PROTONIX Take 40 mg by mouth daily.   triamterene-hydrochlorothiazide 37.5-25  MG tablet Commonly known as: MAXZIDE-25 Take 1 tablet by mouth daily.   Ventolin HFA 108 (90 Base) MCG/ACT inhaler Generic drug: albuterol Inhale 1 puff into the lungs every 4 (four) hours as needed.   Vitamin D3 1.25 MG (50000 UT) Caps Take 1 capsule by mouth once a week.        Allergies:  Allergies  Allergen Reactions   Hydrocodone-Acetaminophen Itching   Oxycodone-Acetaminophen Other (See Comments) and Itching    Family History: Family History  Problem Relation Age of Onset   Hyperlipidemia Mother    Hyperthyroidism Mother     Thyroid cancer Father    Marfan syndrome Father    Marfan syndrome Sister    Depression Sister    Depression Brother    Breast cancer Paternal Grandmother        great PGM    Social History:  reports that she quit smoking about 6 years ago. Her smoking use included cigarettes. She started smoking about 16 years ago. She has a 5 pack-year smoking history. She has never used smokeless tobacco. She reports that she does not currently use alcohol. She reports current drug use. Drug: Marijuana.  ROS:                                        Physical Exam: BP 121/86   Pulse 97   LMP 02/10/2022 (Exact Date)   Constitutional:  Alert and oriented, No acute distress.   Laboratory Data: No results found for: "WBC", "HGB", "HCT", "MCV", "PLT"  No results found for: "CREATININE"  No results found for: "PSA"  No results found for: "TESTOSTERONE"  No results found for: "HGBA1C"  Urinalysis    Component Value Date/Time   COLORURINE Yellow 10/09/2013 0859   APPEARANCEUR Clear 08/09/2022 1529   LABSPEC 1.006 10/09/2013 0859   PHURINE 6.0 10/09/2013 0859   GLUCOSEU Negative 08/09/2022 1529   GLUCOSEU Negative 10/09/2013 0859   HGBUR Negative 10/09/2013 0859   BILIRUBINUR Negative 08/09/2022 1529   BILIRUBINUR Negative 10/09/2013 0859   KETONESUR Negative 10/09/2013 0859   PROTEINUR Negative 08/09/2022 1529   PROTEINUR Negative 10/09/2013 0859   UROBILINOGEN 0.2 05/07/2020 1122   NITRITE Negative 08/09/2022 1529   NITRITE Negative 10/09/2013 0859   LEUKOCYTESUR Negative 08/09/2022 1529   LEUKOCYTESUR Negative 10/09/2013 0859    Pertinent Imaging:   Assessment & Plan: I renewed both medicine 90 x 3 and see in a year  1. Mixed incontinence  - Urinalysis, Complete   No follow-ups on file.  Martina Sinner, MD  [facility] [address], [address] [place], [place] [address] [phone]

## 2023-08-09 LAB — MICROSCOPIC EXAMINATION
Bacteria, UA: NONE SEEN
RBC, Urine: NONE SEEN /[HPF] (ref 0–2)

## 2023-08-09 LAB — URINALYSIS, COMPLETE
Bilirubin, UA: NEGATIVE
Glucose, UA: NEGATIVE
Ketones, UA: NEGATIVE
Leukocytes,UA: NEGATIVE
Nitrite, UA: NEGATIVE
Protein,UA: NEGATIVE
RBC, UA: NEGATIVE
Specific Gravity, UA: 1.015 (ref 1.005–1.030)
Urobilinogen, Ur: 0.2 mg/dL (ref 0.2–1.0)
pH, UA: 7 (ref 5.0–7.5)

## 2023-08-25 ENCOUNTER — Ambulatory Visit: Payer: Self-pay

## 2023-08-25 NOTE — Telephone Encounter (Signed)
  Chief Complaint: Medication refills needed Symptoms: none Frequency: now Pertinent Negatives: Patient denies  Disposition: [] ED /[] Urgent Care (no appt availability in office) / [] Appointment(In office/virtual)/ []  [facility]/ [] Home Care/ [] Refused Recommended Disposition /[] [place] [facility]/ [x]  Follow-up with PCP Additional Notes: Pt has several medications that she needs to have refilled. Pt is not yet established at [facility], and so we are unable to refill medications. Pt will contact current pcp for refills. If pcp is unable to refill pt will go to [facility] for refills.    Summary: Medication Request-New Pt   Pt is calling in and has a new patient appointment on 09/23/23 and wants to know if Byrd Hesselbach would be able to refill her anxiety meds for her. Pt says she is almost out. Pt is requesting a call back from a nurse         Reason for Disposition  [1] Prescription refill request for ESSENTIAL medicine (i.e., likelihood of harm to patient if not taken) AND [2] triager unable to refill per department policy  Answer Assessment - Initial Assessment Questions 1. DRUG NAME: "What medicine do you need to have refilled?"     All of her current meds 2. REFILLS REMAINING: "How many refills are remaining?" (Note: The label on the medicine or pill bottle will show how many refills are remaining. If there are no refills remaining, then a renewal may be needed.)     None  Protocols used: Medication Refill and Renewal Call-A-AH

## 2023-09-12 ENCOUNTER — Encounter: Payer: Self-pay | Admitting: Urology

## 2023-09-15 ENCOUNTER — Ambulatory Visit: Payer: Self-pay | Admitting: *Deleted

## 2023-09-15 NOTE — Telephone Encounter (Signed)
Patient is out of wellburtrin and lexapro Reason for Disposition  MODERATE anxiety (e.g., persistent or frequent anxiety symptoms; interferes with sleep, school, or work)  Answer Assessment - Initial Assessment Questions 1. CONCERN: "Did anything happen that prompted you to call today?"      Patient is out Wellbutrin and lexapro- since September- PCP closed 2. ANXIETY SYMPTOMS: "Can you describe how you (your loved one; patient) have been feeling?" (e.g., tense, restless, panicky, anxious, keyed up, overwhelmed, sense of impending doom).      Patient reports her anxiety is not good- she gets set off 3. ONSET: "How long have you been feeling this way?" (e.g., hours, days, weeks)     Since September- she has been out of medication 4. SEVERITY: "How would you rate the level of anxiety?" (e.g., 0 - 10; or mild, moderate, severe).     Severe- out of medication 5. FUNCTIONAL IMPAIRMENT: "How have these feelings affected your ability to do daily activities?" "Have you had more difficulty than usual doing your normal daily activities?" (e.g., getting better, same, worse; self-care, school, work, interactions)     Affecting daily activities- " doesn't want to do anything 6. HISTORY: "Have you felt this way before?" "Have you ever been diagnosed with an anxiety problem in the past?" (e.g., generalized anxiety disorder, panic attacks, PTSD). If Yes, ask: "How was this problem treated?" (e.g., medicines, counseling, etc.)     B"teenager" - patient has Buspar 7. RISK OF HARM - SUICIDAL IDEATION: "Do you ever have thoughts of hurting or killing yourself?" If Yes, ask:  "Do you have these feelings now?" "Do you have a plan on how you would do this?"     No- no plans 8. TREATMENT:  "What has been done so far to treat this anxiety?" (e.g., medicines, relaxation strategies). "What has helped?"     Patient has been on medication for 7 years 9. TREATMENT - THERAPIST: "Do you have a counselor or therapist? Name?"      No- patient has only had medication  10. POTENTIAL TRIGGERS: "Do you drink caffeinated beverages (e.g., coffee, colas, teas), and how much daily?" "Do you drink alcohol or use any drugs?" "Have you started any new medicines recently?"       No caffeine, alcohol  11. PATIENT SUPPORT: "Who is with you now?" "Who do you live with?" "Do you have family or friends who you can talk to?"        Good supports system 12. OTHER SYMPTOMS: "Do you have any other symptoms?" (e.g., feeling depressed, trouble concentrating, trouble sleeping, trouble breathing, palpitations or fast heartbeat, chest pain, sweating, nausea, or diarrhea)       Trouble sleeping, nausea at times, chest pains  Protocols used: Anxiety and Panic Attack-A-AH

## 2023-09-15 NOTE — Telephone Encounter (Signed)
  Chief Complaint: patient is out of her anxiety medications- she has NP appointment - but needs bridge medication  Symptoms: anxiety, depression- not controled Frequency: chronic Pertinent Negatives: Patient denies suicidal thoughts  Disposition: [] ED /[x] Urgent Care (no appt availability in office) / [] Appointment(In office/virtual)/ []  [facility]/ [] Home Care/ [] Refused Recommended Disposition /[] [place] [facility]/ []  Follow-up with PCP Additional Notes: Patient states she has NP appointment- next week- she has been out of her chronic treatment anxiety medication since September. Patient is on cancellation list- information given for [facility]- patient advised to contact the center for possible appointment- for refill/bridge until her appointment. She is grateful for the information.

## 2023-09-19 ENCOUNTER — Ambulatory Visit (INDEPENDENT_AMBULATORY_CARE_PROVIDER_SITE_OTHER): Payer: 59 | Admitting: Physician Assistant

## 2023-09-19 VITALS — BP 119/65 | HR 102 | Ht 64.0 in | Wt 196.8 lb

## 2023-09-19 DIAGNOSIS — R350 Frequency of micturition: Secondary | ICD-10-CM

## 2023-09-19 LAB — URINALYSIS, COMPLETE
Bilirubin, UA: NEGATIVE
Glucose, UA: NEGATIVE
Ketones, UA: NEGATIVE
Leukocytes,UA: NEGATIVE
Nitrite, UA: NEGATIVE
Protein,UA: NEGATIVE
RBC, UA: NEGATIVE
Specific Gravity, UA: 1.02 (ref 1.005–1.030)
Urobilinogen, Ur: 0.2 mg/dL (ref 0.2–1.0)
pH, UA: 7.5 (ref 5.0–7.5)

## 2023-09-19 LAB — BLADDER SCAN AMB NON-IMAGING: Scan Result: 16

## 2023-09-19 LAB — MICROSCOPIC EXAMINATION

## 2023-09-19 NOTE — Progress Notes (Unsigned)
09/19/2023 2:25 PM   Natalie [name] 02-18-1976 [id number]  CC: Chief Complaint  Patient presents with   Urinary Frequency   HPI: Natalie [name] is a 47 y.o. 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   Today she reports a 3-week history of nocturia x 2-3.  She will have some tingling sensations with voiding and pelvic discomfort/cramping.  She denies fevers or gross hematuria.  She admits to some increased stress lately, because she ran out of her anxiety medications about 3 months ago.  She denies constipation.  In-office UA and microscopy pan negative. PVR 16mL.  PMH: Past Medical History:  Diagnosis Date   Bacterial vaginosis    Dysplasia of cervix, low grade (CIN 1) 02/15/2017   Will need repeat pap smear in 1 year.    Hyperlipemia    Hypertension    Hypothyroidism (acquired)    Marfan syndrome    Ovarian cyst    Vitamin D deficiency     Surgical History: Past Surgical History:  Procedure Laterality Date   DILATION AND CURETTAGE OF UTERUS     TONSILECTOMY/ADENOIDECTOMY WITH MYRINGOTOMY     TUBAL LIGATION      Home Medications:  Allergies as of 09/19/2023       Reactions   Hydrocodone-acetaminophen Itching   Oxycodone-acetaminophen Other (See Comments), Itching        Medication List        Accurate as of September 19, 2023  2:25 PM. If you have any questions, ask your nurse or doctor.          atorvastatin 20 MG tablet Commonly known as: LIPITOR Take 20 mg by mouth daily.   buPROPion 150 MG 24 hr tablet Commonly known as: WELLBUTRIN XL Take 1 tablet by mouth every morning.   busPIRone 10 MG tablet Commonly known as: BUSPAR Take 10 mg by mouth 3 (three) times daily as needed.   escitalopram 10 MG tablet Commonly known as: LEXAPRO Take 10 mg by mouth daily.   fesoterodine 8 MG Tb24 tablet Commonly known as: TOVIAZ Take 1 tablet (8 mg total) by mouth daily.    levothyroxine 50 MCG tablet Commonly known as: SYNTHROID Take 50 mcg by mouth daily before breakfast.   nitrofurantoin (macrocrystal-monohydrate) 100 MG capsule Commonly known as: MACROBID Take 1 capsule (100 mg total) by mouth daily.   pantoprazole 40 MG tablet Commonly known as: PROTONIX Take 40 mg by mouth daily.   triamterene-hydrochlorothiazide 37.5-25 MG tablet Commonly known as: MAXZIDE-25 Take 1 tablet by mouth daily.   Ventolin HFA 108 (90 Base) MCG/ACT inhaler Generic drug: albuterol Inhale 1 puff into the lungs every 4 (four) hours as needed.   Vitamin D3 1.25 MG (50000 UT) Caps Take 1 capsule by mouth once a week.        Allergies:  Allergies  Allergen Reactions   Hydrocodone-Acetaminophen Itching   Oxycodone-Acetaminophen Other (See Comments) and Itching    Family History: Family History  Problem Relation Age of Onset   Hyperlipidemia Mother    Hyperthyroidism Mother    Thyroid cancer Father    Marfan syndrome Father    Marfan syndrome Sister    Depression Sister    Depression Brother    Breast cancer Paternal Grandmother        great PGM    Social History:   reports that she quit smoking about 6 years ago. Her smoking use included cigarettes. She started smoking  about 16 years ago. She has a 5 pack-year smoking history. She has never used smokeless tobacco. She reports that she does not currently use alcohol. She reports current drug use. Drug: Marijuana.  Physical Exam: BP 119/65   Pulse (!) 102   Ht 5\' 4"  (1.626 m)   Wt 196 lb 12.8 oz (89.3 kg)   LMP 02/10/2022 (Exact Date)   BMI 33.78 kg/m   Constitutional:  Alert and oriented, no acute distress, nontoxic appearing HEENT: [place], AT Cardiovascular: No clubbing, cyanosis, or edema Respiratory: Normal respiratory effort, no increased work of breathing Skin: No rashes, bruises or suspicious lesions Neurologic: Grossly intact, no focal deficits, moving all 4 extremities Psychiatric: Normal  mood and affect  Laboratory Data: Results for orders placed or performed in visit on 09/19/23  Microscopic Examination   Urine  Result Value Ref Range   WBC, UA 0-5 0 - 5 /hpf   RBC, Urine 0-2 0 - 2 /hpf   Epithelial Cells (non renal) 0-10 0 - 10 /hpf   Bacteria, UA Few None seen/Few  Urinalysis, Complete  Result Value Ref Range   Specific Gravity, UA 1.020 1.005 - 1.030   pH, UA 7.5 5.0 - 7.5   Color, UA Yellow Yellow   Appearance Ur Clear Clear   Leukocytes,UA Negative Negative   Protein,UA Negative Negative/Trace   Glucose, UA Negative Negative   Ketones, UA Negative Negative   RBC, UA Negative Negative   Bilirubin, UA Negative Negative   Urobilinogen, Ur 0.2 0.2 - 1.0 mg/dL   Nitrite, UA Negative Negative   Microscopic Examination See below:   Bladder Scan (Post Void Residual) in office  Result Value Ref Range   Scan Result 16 ml    Assessment & Plan:   1. Urinary frequency UA bland and PVR WNL.  Will send urine for culture and treat as indicated, though suspect this will be negative.  I am curious if being off anxiety medications has contributed to easier awakening overnight and if resuming these will help with her nocturia.  She will let us know via MyChart if this is the case. - Bladder Scan (Post Void Residual) in office - Urinalysis, Complete - CULTURE, URINE COMPREHENSIVE   Return for will call with results.  Carman Ching, PA-C  [facility] [place] [address] [place], [place] [address] [phone]

## 2023-09-22 LAB — CULTURE, URINE COMPREHENSIVE

## 2023-09-23 ENCOUNTER — Ambulatory Visit: Payer: 59 | Admitting: Pediatrics

## 2023-09-23 ENCOUNTER — Encounter: Payer: Self-pay | Admitting: Pediatrics

## 2023-09-23 VITALS — BP 126/89 | HR 91 | Temp 98.7°F | Ht 65.2 in | Wt 195.6 lb

## 2023-09-23 DIAGNOSIS — Z23 Encounter for immunization: Secondary | ICD-10-CM

## 2023-09-23 DIAGNOSIS — E559 Vitamin D deficiency, unspecified: Secondary | ICD-10-CM | POA: Diagnosis not present

## 2023-09-23 DIAGNOSIS — Z7689 Persons encountering health services in other specified circumstances: Secondary | ICD-10-CM | POA: Diagnosis not present

## 2023-09-23 DIAGNOSIS — K219 Gastro-esophageal reflux disease without esophagitis: Secondary | ICD-10-CM

## 2023-09-23 DIAGNOSIS — E039 Hypothyroidism, unspecified: Secondary | ICD-10-CM | POA: Diagnosis not present

## 2023-09-23 DIAGNOSIS — F419 Anxiety disorder, unspecified: Secondary | ICD-10-CM

## 2023-09-23 DIAGNOSIS — Z6832 Body mass index (BMI) 32.0-32.9, adult: Secondary | ICD-10-CM

## 2023-09-23 DIAGNOSIS — I10 Essential (primary) hypertension: Secondary | ICD-10-CM

## 2023-09-23 DIAGNOSIS — Z131 Encounter for screening for diabetes mellitus: Secondary | ICD-10-CM

## 2023-09-23 DIAGNOSIS — E785 Hyperlipidemia, unspecified: Secondary | ICD-10-CM | POA: Diagnosis not present

## 2023-09-23 DIAGNOSIS — Z1330 Encounter for screening examination for mental health and behavioral disorders, unspecified: Secondary | ICD-10-CM

## 2023-09-23 DIAGNOSIS — M25561 Pain in right knee: Secondary | ICD-10-CM

## 2023-09-23 DIAGNOSIS — K30 Functional dyspepsia: Secondary | ICD-10-CM

## 2023-09-23 MED ORDER — TRIAMTERENE-HCTZ 37.5-25 MG PO TABS: 1.0000 | ORAL_TABLET | Freq: Every day | ORAL | 3 refills | Status: AC

## 2023-09-23 MED ORDER — CELECOXIB 100 MG PO CAPS: 100.0000 mg | ORAL_CAPSULE | Freq: Two times a day (BID) | ORAL | 0 refills | Status: AC

## 2023-09-23 MED ORDER — PANTOPRAZOLE SODIUM 40 MG PO TBEC: 40.0000 mg | DELAYED_RELEASE_TABLET | Freq: Every day | ORAL | 1 refills | Status: AC

## 2023-09-23 MED ORDER — PHENTERMINE HCL 15 MG PO CAPS: 15.0000 mg | ORAL_CAPSULE | ORAL | 1 refills | Status: AC

## 2023-09-23 MED ORDER — VITAMIN D3 1.25 MG (50000 UT) PO CAPS: 1.0000 | ORAL_CAPSULE | ORAL | 3 refills | Status: AC

## 2023-09-23 MED ORDER — ATORVASTATIN CALCIUM 20 MG PO TABS: 20.0000 mg | ORAL_TABLET | Freq: Every day | ORAL | 3 refills | Status: AC
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
## 2023-09-23 NOTE — Patient Instructions (Addendum)
YOUR PLAN:  -RIGHT KNEE OSTEOARTHRITIS: Osteoarthritis is a condition where the protective cartilage that cushions the ends of your bones wears down over time. We will start you on Celebrex twice daily for 7 days for better pain relief and discuss the possibility of steroid injections if needed. Continue to use ice and perform gentle stretches to help manage the pain.  -ANXIETY: Anxiety is a feeling of worry, nervousness, or unease. We will start you on Zoloft 25mg  daily (take half pill for 3-4 days), increasing to 50mg  daily (2 pills) after two weeks if tolerated. Continue taking Buspar as needed.   -WEIGHT MANAGEMENT: We will resume Phentermine 15mg  daily to help with weight loss, with plans to adjust the dose as needed.  INSTRUCTIONS:  Please follow up in 3-4 weeks for a physical and wellness check. We will also check your thyroid levels today and adjust your medication if needed. If your knee pain persists or worsens, or if you experience any side effects from your new medications, please contact our office.  Good to meet you! Welcome to [facility]!  As your primary care doctor, I look forward to working with you to help you reach your health goals.  Please be aware of a couple of logistical items: - If you message me on mychart, it may take me 1-2 business days to get back to you. This is for non-urgent messaging.  - If you require urgent clinical attention, please call the clinic or present to urgent care/emergency room - If you have labs, I typically will send a message about them in 1-2 business days. - I am not here on Mondays, otherwise will be available from Tuesday-Friday during 8a-5pm.

## 2023-09-24 LAB — COMPREHENSIVE METABOLIC PANEL
ALT: 36 [IU]/L — ABNORMAL HIGH (ref 0–32)
AST: 26 [IU]/L (ref 0–40)
Albumin: 4.4 g/dL (ref 3.9–4.9)
Alkaline Phosphatase: 137 [IU]/L — ABNORMAL HIGH (ref 44–121)
BUN/Creatinine Ratio: 15 (ref 9–23)
BUN: 16 mg/dL (ref 6–24)
Bilirubin Total: 0.5 mg/dL (ref 0.0–1.2)
CO2: 24 mmol/L (ref 20–29)
Calcium: 9.9 mg/dL (ref 8.7–10.2)
Chloride: 100 mmol/L (ref 96–106)
Creatinine, Ser: 1.07 mg/dL — ABNORMAL HIGH (ref 0.57–1.00)
Globulin, Total: 2.7 g/dL (ref 1.5–4.5)
Glucose: 95 mg/dL (ref 70–99)
Potassium: 4.1 mmol/L (ref 3.5–5.2)
Sodium: 140 mmol/L (ref 134–144)
Total Protein: 7.1 g/dL (ref 6.0–8.5)
eGFR: 65 mL/min/{1.73_m2} (ref >59)

## 2023-09-24 LAB — THYROID PANEL WITH TSH
Free Thyroxine Index: 2.2 (ref 1.2–4.9)
T3 Uptake Ratio: 22 % — ABNORMAL LOW (ref 24–39)
T4, Total: 10.2 ug/dL (ref 4.5–12.0)
TSH: 2.14 u[IU]/mL (ref 0.450–4.500)

## 2023-09-24 LAB — HEMOGLOBIN A1C
Est. average glucose Bld gHb Est-mCnc: 114 mg/dL
Hgb A1c MFr Bld: 5.6 % (ref 4.8–5.6)

## 2023-09-26 ENCOUNTER — Telehealth: Payer: Self-pay | Admitting: Pediatrics

## 2023-09-26 MED ORDER — SERTRALINE HCL 25 MG PO TABS: 25.0000 mg | ORAL_TABLET | Freq: Every day | ORAL | 1 refills | Status: DC

## 2023-09-26 NOTE — Telephone Encounter (Signed)
Called and notified patient that medication was sent in for her.  ?

## 2023-09-26 NOTE — Telephone Encounter (Signed)
Zoloft not on current list, routing for approval.

## 2023-09-26 NOTE — Telephone Encounter (Signed)
Medication Refill -  Most Recent Primary Care Visit:  Provider: Jackolyn Confer  Department: [facility]  Visit Type: NEW PATIENT  Date: 09/23/2023  Medication: Zoloft  Has the patient contacted their pharmacy? Yes Script is not there, need to contact physician  Is this the correct pharmacy for this prescription? Yes  This is the patient's preferred pharmacy:  [facility] [address]), [place] - [address] [place]) [place] [address] Phone: [phone] Fax: [phone]   Has the prescription been filled recently? No  Is the patient out of the medication? Yes  Has the patient been seen for an appointment in the last year OR does the patient have an upcoming appointment? Yes  Can we respond through MyChart? Yes  Agent: Please be advised that Rx refills may take up to 3 business days. We ask that you follow-up with your pharmacy.  Medication not on medication list but patient said provider was putting her on the med and it was stated in the AVS from 11/15

## 2023-09-26 NOTE — Telephone Encounter (Signed)
Routing to provider to advise. RX not on med list and note is not finished so unclear whether patient was  going to be prescribed this or not.

## 2023-09-28 ENCOUNTER — Encounter: Payer: Self-pay | Admitting: Pediatrics

## 2023-09-28 ENCOUNTER — Other Ambulatory Visit: Payer: Self-pay | Admitting: Pediatrics

## 2023-09-28 DIAGNOSIS — E039 Hypothyroidism, unspecified: Secondary | ICD-10-CM | POA: Insufficient documentation

## 2023-09-28 DIAGNOSIS — F419 Anxiety disorder, unspecified: Secondary | ICD-10-CM | POA: Insufficient documentation

## 2023-09-28 DIAGNOSIS — I10 Essential (primary) hypertension: Secondary | ICD-10-CM | POA: Insufficient documentation

## 2023-09-28 DIAGNOSIS — Z6832 Body mass index (BMI) 32.0-32.9, adult: Secondary | ICD-10-CM | POA: Insufficient documentation

## 2023-09-28 DIAGNOSIS — K219 Gastro-esophageal reflux disease without esophagitis: Secondary | ICD-10-CM | POA: Insufficient documentation

## 2023-09-28 MED ORDER — LEVOTHYROXINE SODIUM 50 MCG PO TABS
50.0000 ug | ORAL_TABLET | Freq: Every day | ORAL | 3 refills | Status: AC
Start: 2023-09-28 — End: 2024-09-27

## 2023-09-28 NOTE — Assessment & Plan Note (Signed)
Prior success with Phentermine, off for 2 months. -Resume Phentermine 15mg  daily, with plan to increase dose as tolerated and needed.

## 2023-09-28 NOTE — Assessment & Plan Note (Signed)
Well controlled on pantoprazole. Refills sent.

## 2023-09-28 NOTE — Assessment & Plan Note (Signed)
Well controlled. Refills sent. Will check CMP as well.

## 2023-09-28 NOTE — Assessment & Plan Note (Signed)
On Atorvastatin 20mg  daily, previously on 40mg  daily. -Refill Atorvastatin 20mg  daily.

## 2023-09-28 NOTE — Assessment & Plan Note (Signed)
On Levothyroxine daily, last checked a year ago. -Check thyroid levels today. -Refill Levothyroxine daily, with potential adjustment pending lab results.

## 2023-09-28 NOTE — Assessment & Plan Note (Signed)
Off Lexapro and Wellbutrin for several months. Currently on Buspar as needed, but reports difficulty remembering to take it. Experiencing increased stress and frequent crying episodes. -Start Zoloft 25mg  daily, with plan to increase to 50mg  daily after 2 weeks if tolerated. -Continue Buspar as needed.

## 2023-09-28 NOTE — Assessment & Plan Note (Signed)
On weekly Vitamin D 5000 units, currently out of medication. -Send prescription for Vitamin D 5000 units weekly.

## 2023-10-21 ENCOUNTER — Encounter: Payer: 59 | Admitting: Pediatrics

## 2023-10-24 ENCOUNTER — Encounter: Payer: 59 | Admitting: Pediatrics

## 2023-10-25 ENCOUNTER — Encounter: Payer: 59 | Admitting: Pediatrics

## 2023-10-25 ENCOUNTER — Ambulatory Visit: Payer: Self-pay

## 2023-10-25 NOTE — Telephone Encounter (Signed)
Chief Complaint: Medication Question   Disposition: [] ED /[] Urgent Care (no appt availability in office) / [] Appointment(In office/virtual)/ []  [facility]/ [] Home Care/ [] Refused Recommended Disposition /[] [facility]/ [x]  Follow-up with PCP Additional Notes: Patient states she was advised to take 25MG  of Zoloft and if needed increase to 50 MG. Patient states she has been taking 50 MG of Zoloft and is out of medication due to the increase. Patient states the 50 MG dose is very helpful and she would like a new Rx sent to her pharmacy for Zoloft 50 MG. Patient also requesting and increase in the weight loss medication Phentermine stating that she discussed with PCP that 15 MG may not be enough for her. Care advice given and advised patient I would forward request to PCP for recommendation.  Per PCP notes on 09/23/23 doses will be increased as needed and if tolerated. Summary: Pt requests an increase in dosage for 2 medications   Pt requests an increase in dosage for both the sertraline (ZOLOFT) and phentermine. Cb# [phone]     Reason for Disposition  [1] Caller has NON-URGENT medicine question about med that PCP prescribed AND [2] triager unable to answer question  Answer Assessment - Initial Assessment Questions 1. NAME of MEDICINE: "What medicine(s) are you calling about?"     sertraline (ZOLOFT) and phentermine. 2. QUESTION: "What is your question?" (e.g., double dose of medicine, side effect)     Can I get a new Rx sent to the pharmacy for 50MG  zoloft and I need the phentermine medicine increased too.  3. PRESCRIBER: "Who prescribed the medicine?" Reason: if prescribed by specialist, call should be referred to that group.     Dr. Evelene Croon 4. SYMPTOMS: "Do you have any symptoms?" If Yes, ask: "What symptoms are you having?"  "How bad are the symptoms (e.g., mild, moderate, severe)     None  Protocols used: Medication Question Call-A-AH

## 2023-10-26 ENCOUNTER — Telehealth: Payer: Self-pay | Admitting: Pediatrics

## 2023-10-26 ENCOUNTER — Other Ambulatory Visit: Payer: Self-pay | Admitting: Pediatrics

## 2023-10-26 DIAGNOSIS — F419 Anxiety disorder, unspecified: Secondary | ICD-10-CM

## 2023-10-26 MED ORDER — SERTRALINE HCL 50 MG PO TABS: 50.0000 mg | ORAL_TABLET | Freq: Every day | ORAL | 1 refills | Status: AC

## 2023-10-26 NOTE — Telephone Encounter (Signed)
Medication Refill -  Most Recent Primary Care Visit:  Provider: Modena Nunnery P  Department: [facility]  Visit Type: NEW PATIENT  Date: 09/23/2023  Medication: sertraline (ZOLOFT) 50 MG tablet  (this is a new dose, was at 25 mg.  phentermine 15 MG capsule  pt states Dr Evelene Croon told her to call when she needed an increase, and she does.  Has the patient contacted their pharmacy? No (Agent: If no, request that the patient contact the pharmacy for the refill. If patient does not wish to contact the pharmacy document the reason why and proceed with request.) (Agent: If yes, when and what did the pharmacy advise?)  Is this the correct pharmacy for this prescription? Yes If no, delete pharmacy and type the correct one.  This is the patient's preferred pharmacy:  [facility] [address]), [place] - [address] [place]) [place] [address] Phone: [phone] Fax: [phone]   Has the prescription been filled recently? Yes  Is the patient out of the medication? Yes  Has the patient been seen for an appointment in the last year OR does the patient have an upcoming appointment? Yes  Can we respond through MyChart? Yes  Agent: Please be advised that Rx refills may take up to 3 business days. We ask that you follow-up with your pharmacy.

## 2023-10-26 NOTE — Progress Notes (Signed)
Sent zoloft 50mg  daily. Needs appointment for phentermine refill  Natalie Herrman Howell Pringle, MD

## 2023-10-27 NOTE — Telephone Encounter (Signed)
Left message for patient and also sent a MyChart message to make her aware of Dr Carie Caddy recommendations. Advised patient to give our office a call back to get scheduled for an appointment per Dr Evelene Croon.

## 2024-06-15 ENCOUNTER — Encounter: Payer: Self-pay | Admitting: Urology

## 2024-08-06 ENCOUNTER — Ambulatory Visit: Payer: Self-pay | Admitting: Urology

## 2024-08-06 ENCOUNTER — Ambulatory Visit: Admitting: Physician Assistant
